# Patient Record
Sex: Female | Born: 1975
Health system: Southern US, Community
[De-identification: ages and names within clinical notes are randomized; demographics above are authoritative.]

## PROBLEM LIST (undated history)

## (undated) DIAGNOSIS — Z86718 Personal history of other venous thrombosis and embolism: Secondary | ICD-10-CM

## (undated) DIAGNOSIS — D689 Coagulation defect, unspecified: Secondary | ICD-10-CM

## (undated) DIAGNOSIS — T7840XA Allergy, unspecified, initial encounter: Secondary | ICD-10-CM

## (undated) DIAGNOSIS — D649 Anemia, unspecified: Secondary | ICD-10-CM

## (undated) DIAGNOSIS — D6861 Antiphospholipid syndrome: Secondary | ICD-10-CM

## (undated) DIAGNOSIS — K219 Gastro-esophageal reflux disease without esophagitis: Secondary | ICD-10-CM

## (undated) DIAGNOSIS — O99119 Other diseases of the blood and blood-forming organs and certain disorders involving the immune mechanism complicating pregnancy, unspecified trimester: Secondary | ICD-10-CM

## (undated) DIAGNOSIS — R000 Tachycardia, unspecified: Secondary | ICD-10-CM

## (undated) DIAGNOSIS — G90A Postural orthostatic tachycardia syndrome (POTS): Secondary | ICD-10-CM

## (undated) DIAGNOSIS — I498 Other specified cardiac arrhythmias: Secondary | ICD-10-CM

## (undated) DIAGNOSIS — I951 Orthostatic hypotension: Secondary | ICD-10-CM

## (undated) DIAGNOSIS — K200 Eosinophilic esophagitis: Secondary | ICD-10-CM

## (undated) HISTORY — DX: Other specified cardiac arrhythmias: I49.8

## (undated) HISTORY — DX: Gastro-esophageal reflux disease without esophagitis: K21.9

## (undated) HISTORY — DX: Tachycardia, unspecified: R00.0

## (undated) HISTORY — DX: Personal history of other venous thrombosis and embolism: Z86.718

## (undated) HISTORY — DX: Postural orthostatic tachycardia syndrome (POTS): G90.A

## (undated) HISTORY — PX: MOHS SURGERY: SUR867

## (undated) HISTORY — DX: Anemia, unspecified: D64.9

## (undated) HISTORY — PX: UPPER GASTROINTESTINAL ENDOSCOPY: SHX188

## (undated) HISTORY — DX: Antiphospholipid syndrome: D68.61

## (undated) HISTORY — PX: ESOPHAGOGASTRODUODENOSCOPY: SHX1529

## (undated) HISTORY — DX: Allergy, unspecified, initial encounter: T78.40XA

## (undated) HISTORY — DX: Antiphospholipid syndrome: O99.119

## (undated) HISTORY — DX: Eosinophilic esophagitis: K20.0

## (undated) HISTORY — PX: LASIK: SHX215

## (undated) HISTORY — DX: Coagulation defect, unspecified: D68.9

## (undated) HISTORY — DX: Orthostatic hypotension: I95.1

---

## 1999-10-17 ENCOUNTER — Other Ambulatory Visit: Admission: RE | Admit: 1999-10-17 | Discharge: 1999-10-17 | Payer: Self-pay | Admitting: Obstetrics and Gynecology

## 1999-11-25 ENCOUNTER — Other Ambulatory Visit: Admission: RE | Admit: 1999-11-25 | Discharge: 1999-11-25 | Payer: Self-pay | Admitting: Obstetrics and Gynecology

## 2000-02-11 ENCOUNTER — Emergency Department (HOSPITAL_COMMUNITY): Admission: EM | Admit: 2000-02-11 | Discharge: 2000-02-11 | Payer: Self-pay | Admitting: Emergency Medicine

## 2000-02-12 ENCOUNTER — Ambulatory Visit (HOSPITAL_COMMUNITY): Admission: RE | Admit: 2000-02-12 | Discharge: 2000-02-12 | Payer: Self-pay | Admitting: *Deleted

## 2000-02-12 ENCOUNTER — Encounter: Payer: Self-pay | Admitting: Emergency Medicine

## 2000-02-28 ENCOUNTER — Other Ambulatory Visit: Admission: RE | Admit: 2000-02-28 | Discharge: 2000-02-28 | Payer: Self-pay | Admitting: Obstetrics and Gynecology

## 2001-03-04 ENCOUNTER — Other Ambulatory Visit: Admission: RE | Admit: 2001-03-04 | Discharge: 2001-03-04 | Payer: Self-pay | Admitting: Obstetrics and Gynecology

## 2001-06-24 ENCOUNTER — Other Ambulatory Visit: Admission: RE | Admit: 2001-06-24 | Discharge: 2001-06-24 | Payer: Self-pay | Admitting: *Deleted

## 2002-03-22 ENCOUNTER — Other Ambulatory Visit: Admission: RE | Admit: 2002-03-22 | Discharge: 2002-03-22 | Payer: Self-pay | Admitting: Obstetrics and Gynecology

## 2002-10-20 ENCOUNTER — Other Ambulatory Visit: Admission: RE | Admit: 2002-10-20 | Discharge: 2002-10-20 | Payer: Self-pay | Admitting: Obstetrics and Gynecology

## 2003-03-30 ENCOUNTER — Other Ambulatory Visit: Admission: RE | Admit: 2003-03-30 | Discharge: 2003-03-30 | Payer: Self-pay | Admitting: Obstetrics and Gynecology

## 2004-05-28 ENCOUNTER — Ambulatory Visit: Payer: Self-pay | Admitting: Internal Medicine

## 2004-05-29 ENCOUNTER — Ambulatory Visit: Payer: Self-pay | Admitting: Oncology

## 2004-08-19 ENCOUNTER — Ambulatory Visit: Payer: Self-pay | Admitting: Oncology

## 2004-10-09 ENCOUNTER — Ambulatory Visit: Payer: Self-pay | Admitting: Oncology

## 2004-11-25 ENCOUNTER — Ambulatory Visit: Payer: Self-pay | Admitting: Oncology

## 2004-12-19 ENCOUNTER — Inpatient Hospital Stay (HOSPITAL_COMMUNITY): Admission: AD | Admit: 2004-12-19 | Discharge: 2004-12-21 | Payer: Self-pay | Admitting: Obstetrics and Gynecology

## 2005-03-03 ENCOUNTER — Ambulatory Visit: Payer: Self-pay | Admitting: Oncology

## 2016-04-15 ENCOUNTER — Telehealth: Payer: Self-pay | Admitting: Internal Medicine

## 2016-04-15 NOTE — Telephone Encounter (Signed)
Patient states her&her husband use to be your patients about 10years ago. They moved back to the area and would like to be your patient again. Her husband is Zebedee IbaMichael Snoke. Please advise. Thank you.

## 2016-04-18 NOTE — Telephone Encounter (Signed)
Patient is calling again about this. Please advise. °

## 2016-04-23 NOTE — Telephone Encounter (Signed)
OK THx 

## 2016-04-23 NOTE — Telephone Encounter (Signed)
I have made both of them an appointment in Nov.

## 2016-05-09 LAB — HEPATIC FUNCTION PANEL
ALK PHOS: 60 U/L (ref 25–125)
ALT: 12 U/L (ref 7–35)
AST: 21 U/L (ref 13–35)
BILIRUBIN, TOTAL: 0.7 mg/dL

## 2016-05-09 LAB — CBC AND DIFFERENTIAL
HCT: 41 % (ref 36–46)
HEMOGLOBIN: 14.2 g/dL (ref 12.0–16.0)
NEUTROS ABS: 5 /uL
PLATELETS: 227 10*3/uL (ref 150–399)
WBC: 8 10^3/mL

## 2016-05-09 LAB — BASIC METABOLIC PANEL
BUN: 14 mg/dL (ref 4–21)
CREATININE: 0.6 mg/dL (ref <=1.1)
Glucose: 92 mg/dL
Potassium: 4.6 mmol/L (ref 3.4–5.3)
SODIUM: 143 mmol/L (ref 137–147)

## 2016-05-26 ENCOUNTER — Encounter: Payer: Self-pay | Admitting: Internal Medicine

## 2016-05-26 ENCOUNTER — Ambulatory Visit (INDEPENDENT_AMBULATORY_CARE_PROVIDER_SITE_OTHER): Payer: BLUE CROSS/BLUE SHIELD | Admitting: Internal Medicine

## 2016-05-26 VITALS — BP 118/92 | HR 78 | Temp 98.5°F | Ht 64.0 in | Wt 169.0 lb

## 2016-05-26 DIAGNOSIS — F4321 Adjustment disorder with depressed mood: Secondary | ICD-10-CM | POA: Insufficient documentation

## 2016-05-26 DIAGNOSIS — Z23 Encounter for immunization: Secondary | ICD-10-CM

## 2016-05-26 DIAGNOSIS — R000 Tachycardia, unspecified: Secondary | ICD-10-CM

## 2016-05-26 DIAGNOSIS — D6861 Antiphospholipid syndrome: Secondary | ICD-10-CM | POA: Diagnosis not present

## 2016-05-26 DIAGNOSIS — F4320 Adjustment disorder, unspecified: Secondary | ICD-10-CM | POA: Diagnosis not present

## 2016-05-26 DIAGNOSIS — I951 Orthostatic hypotension: Secondary | ICD-10-CM

## 2016-05-26 DIAGNOSIS — G90A Postural orthostatic tachycardia syndrome (POTS): Secondary | ICD-10-CM | POA: Insufficient documentation

## 2016-05-26 DIAGNOSIS — Z0000 Encounter for general adult medical examination without abnormal findings: Secondary | ICD-10-CM | POA: Insufficient documentation

## 2016-05-26 MED ORDER — VITAMIN D3 50 MCG (2000 UT) PO CAPS
2000.0000 [IU] | ORAL_CAPSULE | Freq: Every day | ORAL | 3 refills | Status: DC
Start: 2016-05-26 — End: 2016-07-24

## 2016-05-26 MED ORDER — METOPROLOL SUCCINATE ER 25 MG PO TB24: 25.0000 mg | ORAL_TABLET | Freq: Every day | ORAL | 3 refills | Status: DC

## 2016-05-26 NOTE — Assessment & Plan Note (Signed)
We discussed age appropriate health related issues, including available/recomended screening tests and vaccinations. We discussed a need for adhering to healthy diet and exercise. Labs/EKG were reviewed/ordered. All questions were answered.   

## 2016-05-26 NOTE — Assessment & Plan Note (Signed)
2013 on Metoprolol

## 2016-05-26 NOTE — Assessment & Plan Note (Signed)
Remote 2007 -- Dr Darnelle CatalanMagrinat No DVT ASA for travel

## 2016-05-26 NOTE — Progress Notes (Signed)
[facility] results received. PCP reviewed. Results abstracted and sent to scan.

## 2016-05-26 NOTE — Progress Notes (Signed)
Pre visit review using our clinic review tool, if applicable. No additional management support is needed unless otherwise documented below in the visit note. 

## 2016-05-26 NOTE — Progress Notes (Signed)
Subjective:  Patient ID: Pamela Thompson, female    DOB: March 28, 1976  Age: 40 y.o. MRN: [id number]  CC: Annual Exam (annual exam)   HPI Pamela Thompson presents for a new pt visit - well exam. The pt was dx'd with POTS syndrome since 2012-06-18 -  On Metoprolol now.  Anti-cardiolipide Ab  Moved back from [facility] 06/18/2016 Brother died in an accident 06/18/2016   No outpatient prescriptions prior to visit.   No facility-administered medications prior to visit.     ROS Review of Systems  Constitutional: Negative for activity change, appetite change, chills, fatigue and unexpected weight change.  HENT: Negative for congestion, mouth sores and sinus pressure.   Eyes: Negative for visual disturbance.  Respiratory: Negative for cough and chest tightness.   Gastrointestinal: Negative for abdominal pain and nausea.  Genitourinary: Negative for difficulty urinating, frequency and vaginal pain.  Musculoskeletal: Negative for back pain and gait problem.  Skin: Negative for pallor and rash.  Neurological: Negative for dizziness, tremors, weakness, numbness and headaches.  Psychiatric/Behavioral: Negative for confusion, sleep disturbance and suicidal ideas. The patient is not nervous/anxious.     Objective:  BP (!) 118/92 (BP Location: Left Arm, Patient Position: Sitting, Cuff Size: Normal)   Pulse 78   Temp 98.5 F (36.9 C)   Ht 5\' 4"  (1.626 m)   Wt 169 lb (76.7 kg)   SpO2 98%   BMI 29.01 kg/m   BP Readings from Last 3 Encounters:  05/26/16 (!) 118/92    Wt Readings from Last 3 Encounters:  05/26/16 169 lb (76.7 kg)    Physical Exam  Constitutional: She appears well-developed. No distress.  HENT:  Head: Normocephalic.  Right Ear: External ear normal.  Left Ear: External ear normal.  Nose: Nose normal.  Mouth/Throat: Oropharynx is clear and moist.  Eyes: Conjunctivae are normal. Pupils are equal, round, and reactive to light. Right eye exhibits no discharge. Left eye exhibits no  discharge.  Neck: Normal range of motion. Neck supple. No JVD present. No tracheal deviation present. No thyromegaly present.  Cardiovascular: Normal rate, regular rhythm and normal heart sounds.   Pulmonary/Chest: No stridor. No respiratory distress. She has no wheezes.  Abdominal: Soft. Bowel sounds are normal. She exhibits no distension and no mass. There is no tenderness. There is no rebound and no guarding.  Musculoskeletal: She exhibits no edema or tenderness.  Lymphadenopathy:    She has no cervical adenopathy.  Neurological: She displays normal reflexes. No cranial nerve deficit. She exhibits normal muscle tone. Coordination normal.  Skin: No rash noted. No erythema.  Psychiatric: She has a normal mood and affect. Her behavior is normal. Judgment and thought content normal.  tearful  No results found for: WBC, HGB, HCT, PLT, GLUCOSE, CHOL, TRIG, HDL, LDLDIRECT, LDLCALC, ALT, AST, NA, K, CL, CREATININE, BUN, CO2, TSH, PSA, INR, GLUF, HGBA1C, MICROALBUR  No results found.  Assessment & Plan:   There are no diagnoses linked to this encounter. I am having Ms. Thibeau maintain her metoprolol succinate.  Meds ordered this encounter  Medications  . metoprolol succinate (TOPROL-XL) 25 MG 24 hr tablet     Follow-up: No Follow-up on file.  Sonda Primes, MD

## 2016-05-26 NOTE — Assessment & Plan Note (Addendum)
Brother died in the accident 2017 counseling offered

## 2016-05-27 ENCOUNTER — Telehealth: Payer: Self-pay | Admitting: Internal Medicine

## 2016-05-27 NOTE — Telephone Encounter (Signed)
Patient called and said we used the wrong pharmacy. She wanted to make sure from now on we use this one.  [facility] on [address], [place], [place] [address] [phone]

## 2016-05-28 NOTE — Telephone Encounter (Signed)
Updated pharmacy.../lmb 

## 2016-05-30 ENCOUNTER — Other Ambulatory Visit (INDEPENDENT_AMBULATORY_CARE_PROVIDER_SITE_OTHER): Payer: BLUE CROSS/BLUE SHIELD

## 2016-05-30 DIAGNOSIS — G90A Postural orthostatic tachycardia syndrome (POTS): Secondary | ICD-10-CM

## 2016-05-30 DIAGNOSIS — I951 Orthostatic hypotension: Secondary | ICD-10-CM | POA: Diagnosis not present

## 2016-05-30 DIAGNOSIS — R000 Tachycardia, unspecified: Secondary | ICD-10-CM

## 2016-05-30 DIAGNOSIS — Z0000 Encounter for general adult medical examination without abnormal findings: Secondary | ICD-10-CM | POA: Diagnosis not present

## 2016-05-30 LAB — HEPATIC FUNCTION PANEL
ALBUMIN: 4.4 g/dL (ref 3.5–5.2)
ALK PHOS: 42 U/L (ref 39–117)
ALT: 13 U/L (ref 0–35)
AST: 20 U/L (ref 0–37)
BILIRUBIN DIRECT: 0.1 mg/dL (ref 0.0–0.3)
BILIRUBIN TOTAL: 0.7 mg/dL (ref 0.2–1.2)
Total Protein: 7.2 g/dL (ref 6.0–8.3)

## 2016-05-30 LAB — LIPID PANEL
CHOLESTEROL: 174 mg/dL (ref 0–200)
HDL: 52.9 mg/dL (ref >39.00)
LDL Cholesterol: 105 mg/dL — ABNORMAL HIGH (ref 0–99)
NonHDL: 120.62
Total CHOL/HDL Ratio: 3
Triglycerides: 78 mg/dL (ref 0.0–149.0)
VLDL: 15.6 mg/dL (ref 0.0–40.0)

## 2016-05-30 LAB — CBC WITH DIFFERENTIAL/PLATELET
BASOS ABS: 0 10*3/uL (ref 0.0–0.1)
Basophils Relative: 0.4 % (ref 0.0–3.0)
EOS ABS: 0.3 10*3/uL (ref 0.0–0.7)
Eosinophils Relative: 4.3 % (ref 0.0–5.0)
HCT: 41.3 % (ref 36.0–46.0)
Hemoglobin: 14.1 g/dL (ref 12.0–15.0)
LYMPHS ABS: 1.9 10*3/uL (ref 0.7–4.0)
Lymphocytes Relative: 27.1 % (ref 12.0–46.0)
MCHC: 34.1 g/dL (ref 30.0–36.0)
MCV: 92.1 fl (ref 78.0–100.0)
MONO ABS: 0.4 10*3/uL (ref 0.1–1.0)
MONOS PCT: 6.2 % (ref 3.0–12.0)
NEUTROS ABS: 4.3 10*3/uL (ref 1.4–7.7)
NEUTROS PCT: 62 % (ref 43.0–77.0)
PLATELETS: 231 10*3/uL (ref 150.0–400.0)
RBC: 4.49 Mil/uL (ref 3.87–5.11)
RDW: 13.1 % (ref 11.5–15.5)
WBC: 6.9 10*3/uL (ref 4.0–10.5)

## 2016-05-30 LAB — TSH: TSH: 2.74 u[IU]/mL (ref 0.35–4.50)

## 2016-05-30 LAB — BASIC METABOLIC PANEL
BUN: 15 mg/dL (ref 6–23)
CALCIUM: 9.6 mg/dL (ref 8.4–10.5)
CO2: 27 meq/L (ref 19–32)
CREATININE: 0.81 mg/dL (ref 0.40–1.20)
Chloride: 103 mEq/L (ref 96–112)
GFR: 82.5 mL/min (ref >60.00)
GLUCOSE: 88 mg/dL (ref 70–99)
Potassium: 4 mEq/L (ref 3.5–5.1)
SODIUM: 138 meq/L (ref 135–145)

## 2016-05-30 LAB — URINALYSIS
BILIRUBIN URINE: NEGATIVE
Ketones, ur: NEGATIVE
LEUKOCYTES UA: NEGATIVE
NITRITE: NEGATIVE
PH: 6 (ref 5.0–8.0)
Specific Gravity, Urine: <=1.005 — AB (ref 1.000–1.030)
TOTAL PROTEIN, URINE-UPE24: NEGATIVE
Urine Glucose: NEGATIVE
Urobilinogen, UA: 0.2 (ref 0.0–1.0)

## 2016-06-16 ENCOUNTER — Telehealth: Payer: Self-pay | Admitting: Internal Medicine

## 2016-06-16 MED ORDER — MEBENDAZOLE 100 MG PO CHEW: CHEWABLE_TABLET | ORAL | 0 refills | Status: DC

## 2016-06-16 NOTE — Telephone Encounter (Signed)
Patient states her daughter had pinworms and the whole family to OTC medication.  Patient still feels like she might have it.  Would like to know if she should take OTC medication again or does she need to come in.?

## 2016-06-16 NOTE — Telephone Encounter (Signed)
Ok Mebendazole as directed [facility](Rx emailed) Thx

## 2016-06-17 MED ORDER — MEBENDAZOLE 100 MG PO CHEW: CHEWABLE_TABLET | ORAL | 0 refills | Status: DC

## 2016-06-17 NOTE — Telephone Encounter (Signed)
Patient called back. She was informed.

## 2016-06-17 NOTE — Addendum Note (Signed)
Addended by: Merrilyn PumaSIMMONS, Roni Friberg N on: 06/17/2016 01:33 PM   Modules accepted: Orders

## 2016-06-17 NOTE — Telephone Encounter (Signed)
Printed rx signed/faxed to pharmacy.  

## 2016-07-24 ENCOUNTER — Encounter: Payer: Self-pay | Admitting: Nurse Practitioner

## 2016-07-24 ENCOUNTER — Ambulatory Visit (INDEPENDENT_AMBULATORY_CARE_PROVIDER_SITE_OTHER): Payer: Managed Care, Other (non HMO) | Admitting: Nurse Practitioner

## 2016-07-24 ENCOUNTER — Other Ambulatory Visit (INDEPENDENT_AMBULATORY_CARE_PROVIDER_SITE_OTHER): Payer: Managed Care, Other (non HMO)

## 2016-07-24 VITALS — BP 122/60 | HR 89 | Temp 98.3°F | Resp 14 | Ht 64.0 in | Wt 168.8 lb

## 2016-07-24 DIAGNOSIS — R002 Palpitations: Secondary | ICD-10-CM | POA: Diagnosis not present

## 2016-07-24 LAB — BASIC METABOLIC PANEL
BUN: 9 mg/dL (ref 6–23)
CO2: 32 meq/L (ref 19–32)
CREATININE: 0.78 mg/dL (ref 0.40–1.20)
Calcium: 10.1 mg/dL (ref 8.4–10.5)
Chloride: 102 mEq/L (ref 96–112)
GFR: 86.11 mL/min (ref >60.00)
GLUCOSE: 101 mg/dL — AB (ref 70–99)
Potassium: 4.2 mEq/L (ref 3.5–5.1)
SODIUM: 141 meq/L (ref 135–145)

## 2016-07-24 LAB — MAGNESIUM: Magnesium: 1.9 mg/dL (ref 1.5–2.5)

## 2016-07-24 LAB — HEMOGLOBIN AND HEMATOCRIT, BLOOD
HEMATOCRIT: 41.2 % (ref 36.0–46.0)
HEMOGLOBIN: 14.2 g/dL (ref 12.0–15.0)

## 2016-07-24 NOTE — Patient Instructions (Addendum)
No acute finding on EKG. No previous ECG to compare.  Go to basement for blood draw. You will be called with results.  Consider referral to cardiology if normal labs.   Palpitations Introduction A palpitation is the feeling that your heart:  Has an uneven (irregular) heartbeat.  Is beating faster than normal.  Is fluttering.  Is skipping a beat. This is usually not a serious problem. In some cases, you may need more medical tests. Follow these instructions at home:  Avoid:  Caffeine in coffee, tea, soft drinks, diet pills, and energy drinks.  Chocolate.  Alcohol.  Do not use any tobacco products. These include cigarettes, chewing tobacco, and e-cigarettes. If you need help quitting, ask your doctor.  Try to reduce your stress. These things may help:  Yoga.  Meditation.  Physical activity. Swimming, jogging, and walking are good choices.  A method that helps you use your mind to control things in your body, like heartbeats (biofeedback).  Get plenty of rest and sleep.  Take over-the-counter and prescription medicines only as told by your doctor.  Keep all follow-up visits as told by your doctor. This is important. Contact a doctor if:  Your heartbeat is still fast or uneven after 24 hours.  Your palpitations occur more often. Get help right away if:  You have chest pain.  You feel short of breath.  You have a very bad headache.  You feel dizzy.  You pass out (faint). This information is not intended to replace advice given to you by your health care provider. Make sure you discuss any questions you have with your health care provider. Document Released: 04/08/2008 Document Revised: 12/06/2015 Document Reviewed: 03/15/2015  2017 Elsevier

## 2016-07-24 NOTE — Progress Notes (Signed)
Pre visit review using our clinic review tool, if applicable. No additional management support is needed unless otherwise documented below in the visit note. 

## 2016-07-24 NOTE — Progress Notes (Signed)
Subjective:  Patient ID: Pamela Thompson, female    DOB: [birth year]  Age: 41 y.o. MRN: [id number]  CC: Acute Visit (patient says she feels her heart twitching and feels a pause x 3 days)   Palpitations   This is a new problem. The current episode started in the past 7 days. The problem occurs every several days (every other day). The problem has been waxing and waning. Nothing aggravates the symptoms. Associated symptoms include an irregular heartbeat. Pertinent negatives include no anxiety, chest fullness, chest pain, coughing, diaphoresis, dizziness, fever, malaise/fatigue, nausea, near-syncope, numbness, shortness of breath, syncope, vomiting or weakness. Associated symptoms comments: No change in sleep habits. No OTC medication used. She has tried beta blockers (elimination of ETOH and caffeine. increased fluid intake. use of metoprolol x 3-4years to manage POTS) for the symptoms. The treatment provided no relief. There are no known risk factors. There is no history of anemia, anxiety, drug use, heart disease or hyperthyroidism.  current symptoms not similar to POTS per patient. Husband had vasectomy, hence no chance of pregnancy.  Outpatient Medications Prior to Visit  Medication Sig Dispense Refill  . metoprolol succinate (TOPROL-XL) 25 MG 24 hr tablet Take 1 tablet (25 mg total) by mouth daily. 90 tablet 3  . Cholecalciferol (VITAMIN D3) 2000 units capsule Take 1 capsule (2,000 Units total) by mouth daily. (Patient not taking: Reported on 07/24/2016) 100 capsule 3  . mebendazole (VERMOX) 100 MG chewable tablet Take 1 tab by mouth once. Repeat in 3 weeks if needed (Patient not taking: Reported on 07/24/2016) 2 tablet 0   No facility-administered medications prior to visit.     ROS See HPI  Objective:  BP 122/60   Pulse 89   Temp 98.3 F (36.8 C) (Oral)   Resp 14   Ht 5\' 4"  (1.626 m)   Wt 168 lb 12.8 oz (76.6 kg)   SpO2 98%   BMI 28.97 kg/m   BP Readings from Last 3  Encounters:  07/24/16 122/60  05/26/16 (!) 118/92    Wt Readings from Last 3 Encounters:  07/24/16 168 lb 12.8 oz (76.6 kg)  05/26/16 169 lb (76.7 kg)    Physical Exam  Constitutional: She is oriented to person, place, and time. No distress.  Neck: Normal range of motion. Neck supple.  Cardiovascular: Normal rate, regular rhythm, normal heart sounds and intact distal pulses.  Exam reveals no gallop and no friction rub.   No murmur heard. Pulmonary/Chest: Effort normal and breath sounds normal. No respiratory distress. She has no wheezes.  Musculoskeletal: Normal range of motion. She exhibits no edema.  Neurological: She is alert and oriented to person, place, and time.  Vitals reviewed.   Lab Results  Component Value Date   WBC 6.9 05/30/2016   HGB 14.2 07/24/2016   HCT 41.2 07/24/2016   PLT 231.0 05/30/2016   GLUCOSE 101 (H) 07/24/2016   CHOL 174 05/30/2016   TRIG 78.0 05/30/2016   HDL 52.90 05/30/2016   LDLCALC 105 (H) 05/30/2016   ALT 13 05/30/2016   AST 20 05/30/2016   NA 141 07/24/2016   K 4.2 07/24/2016   CL 102 07/24/2016   CREATININE 0.78 07/24/2016   BUN 9 07/24/2016   CO2 32 07/24/2016   TSH 2.74 05/30/2016   ECG: normal sinus rhythm, no ST or T wave abnormality.  Assessment & Plan:   Jesiah was seen today for acute visit.  Diagnoses and all orders for this visit:  Palpitations -  Magnesium; Future -     Basic metabolic panel; Future -     Hemoglobin and Hematocrit, Blood; Future -     EKG 12-Lead   I have discontinued Ms. Beecham's Vitamin D3 and mebendazole. I am also having her maintain her metoprolol succinate.  No orders of the defined types were placed in this encounter.  Recent Results (from the past 2160 hour(s))  CBC and differential     Status: None   Collection Time: 05/09/16 12:00 AM  Result Value Ref Range   Hemoglobin 14.2 12.0 - 16.0 g/dL   HCT 41 36 - 46 %   Neutrophils Absolute 5 /L   Platelets 227 150 - 399 K/L   WBC  8.0 10^3/mL  Basic metabolic panel     Status: None   Collection Time: 05/09/16 12:00 AM  Result Value Ref Range   Glucose 92 mg/dL   BUN 14 4 - 21 mg/dL   Creatinine 0.6 0.5 - 1.1 mg/dL   Potassium 4.6 3.4 - 5.3 mmol/L   Sodium 143 137 - 147 mmol/L  Hepatic function panel     Status: None   Collection Time: 05/09/16 12:00 AM  Result Value Ref Range   Alkaline Phosphatase 60 25 - 125 U/L   ALT 12 7 - 35 U/L   AST 21 13 - 35 U/L   Bilirubin, Total 0.7 mg/dL  Basic metabolic panel     Status: None   Collection Time: 05/30/16  8:39 AM  Result Value Ref Range   Sodium 138 135 - 145 mEq/L   Potassium 4.0 3.5 - 5.1 mEq/L   Chloride 103 96 - 112 mEq/L   CO2 27 19 - 32 mEq/L   Glucose, Bld 88 70 - 99 mg/dL   BUN 15 6 - 23 mg/dL   Creatinine, Ser 1.610.81 0.40 - 1.20 mg/dL   Calcium 9.6 8.4 - 09.610.5 mg/dL   GFR 04.5482.50 >09.81>60.00 mL/min  CBC with Differential/Platelet     Status: None   Collection Time: 05/30/16  8:39 AM  Result Value Ref Range   WBC 6.9 4.0 - 10.5 K/uL   RBC 4.49 3.87 - 5.11 Mil/uL   Hemoglobin 14.1 12.0 - 15.0 g/dL   HCT 19.141.3 47.836.0 - 29.546.0 %   MCV 92.1 78.0 - 100.0 fl   MCHC 34.1 30.0 - 36.0 g/dL   RDW 62.113.1 30.811.5 - 65.715.5 %   Platelets 231.0 150.0 - 400.0 K/uL   Neutrophils Relative % 62.0 43.0 - 77.0 %   Lymphocytes Relative 27.1 12.0 - 46.0 %   Monocytes Relative 6.2 3.0 - 12.0 %   Eosinophils Relative 4.3 0.0 - 5.0 %   Basophils Relative 0.4 0.0 - 3.0 %   Neutro Abs 4.3 1.4 - 7.7 K/uL   Lymphs Abs 1.9 0.7 - 4.0 K/uL   Monocytes Absolute 0.4 0.1 - 1.0 K/uL   Eosinophils Absolute 0.3 0.0 - 0.7 K/uL   Basophils Absolute 0.0 0.0 - 0.1 K/uL  Hepatic function panel     Status: None   Collection Time: 05/30/16  8:39 AM  Result Value Ref Range   Total Bilirubin 0.7 0.2 - 1.2 mg/dL   Bilirubin, Direct 0.1 0.0 - 0.3 mg/dL   Alkaline Phosphatase 42 39 - 117 U/L   AST 20 0 - 37 U/L   ALT 13 0 - 35 U/L   Total Protein 7.2 6.0 - 8.3 g/dL   Albumin 4.4 3.5 - 5.2 g/dL    Urinalysis  Status: Abnormal   Collection Time: 05/30/16  8:39 AM  Result Value Ref Range   Color, Urine YELLOW Yellow;Lt. Yellow   APPearance CLEAR Clear   Specific Gravity, Urine <=1.005 (A) 1.000 - 1.030   pH 6.0 5.0 - 8.0   Total Protein, Urine NEGATIVE Negative   Urine Glucose NEGATIVE Negative   Ketones, ur NEGATIVE Negative   Bilirubin Urine NEGATIVE Negative   Hgb urine dipstick TRACE-INTACT (A) Negative   Urobilinogen, UA 0.2 0.0 - 1.0   Leukocytes, UA NEGATIVE Negative   Nitrite NEGATIVE Negative  TSH     Status: None   Collection Time: 05/30/16  8:39 AM  Result Value Ref Range   TSH 2.74 0.35 - 4.50 uIU/mL  Lipid panel     Status: Abnormal   Collection Time: 05/30/16  8:39 AM  Result Value Ref Range   Cholesterol 174 0 - 200 mg/dL    Comment: ATP III Classification       Desirable:  < 200 mg/dL               Borderline High:  200 - 239 mg/dL          High:  > = 161 mg/dL   Triglycerides 09.6 0.0 - 149.0 mg/dL    Comment: Normal:  <045 mg/dLBorderline High:  150 - 199 mg/dL   HDL 40.98 >11.91 mg/dL   VLDL 47.8 0.0 - 29.5 mg/dL   LDL Cholesterol 621 (H) 0 - 99 mg/dL   Total CHOL/HDL Ratio 3     Comment:                Men          Women1/2 Average Risk     3.4          3.3Average Risk          5.0          4.42X Average Risk          9.6          7.13X Average Risk          15.0          11.0                       NonHDL 120.62     Comment: NOTE:  Non-HDL goal should be 30 mg/dL higher than patient's LDL goal (i.e. LDL goal of < 70 mg/dL, would have non-HDL goal of < 100 mg/dL)  Magnesium     Status: None   Collection Time: 07/24/16  2:15 PM  Result Value Ref Range   Magnesium 1.9 1.5 - 2.5 mg/dL  Basic metabolic panel     Status: Abnormal   Collection Time: 07/24/16  2:15 PM  Result Value Ref Range   Sodium 141 135 - 145 mEq/L   Potassium 4.2 3.5 - 5.1 mEq/L   Chloride 102 96 - 112 mEq/L   CO2 32 19 - 32 mEq/L   Glucose, Bld 101 (H) 70 - 99 mg/dL   BUN 9 6  - 23 mg/dL   Creatinine, Ser 3.08 0.40 - 1.20 mg/dL   Calcium 65.7 8.4 - 84.6 mg/dL   GFR 96.29 >52.84 mL/min  Hemoglobin and Hematocrit, Blood     Status: None   Collection Time: 07/24/16  2:15 PM  Result Value Ref Range   HCT 41.2 36.0 - 46.0 %   Hemoglobin 14.2 12.0 - 15.0 g/dL   Follow-up:  Return if symptoms worsen or fail to improve.  Wilfred Lacy, NP

## 2016-07-28 NOTE — Progress Notes (Signed)
Cardiology Office Note    Date:  07/29/2016   ID:  Pamela Thompson, DOB 02/27/1976, MRN [id number]  PCP:  Sonda PrimesAlex Plotnikov, MD  Cardiologist:  [facility] - Dr. Herbie BaltimoreHarding  Chief Complaint  Patient presents with  . Follow-up    New patient. Palpitations.    History of Present Illness:    Pamela Thompson is a 41 y.o. female with past medical history of POTS who presents to the office as a new patient for evaluation of palpitations.   She was seen by her PCP on 07/24/2016 and reported a twitching sensation along her heart, denying any associated chest pain, dyspnea, or presyncope. An EKG was obtained which showed NSR with no acute ischemic changes. Hgb and electrolytes were within normal limits. TSH checked in 05/2016 and normal at that time.   In talking with the patient today, she reports having new-onset palpitations starting two weeks prior. Had consumed more alcohol than normal over the weekend, having two glasses of wine per day, and developing palpitations the following day. She thought this was secondary to her alcohol intake, but the palpitations have continued to occur. Reports they last for several minutes at a time and spontaneously resolve, occurring up to 3-4 times per day.  Feels a "thumping" sensation in her chest when this occurs. No associated chest discomfort, dyspnea with exertion, dizziness, lightheadedness, or presyncope. Does report having intermittent episodes of nausea. Says this does not resemble her prior episodes of POTS. She has limited her caffeine intake with no improvement in her symptoms.   She reports good compliance with her Toprol-XL 25mg  daily.   She was exercising daily prior to Christmas in training for a half-marathon and reports not being as active over the past two weeks due to family visiting over the holidays and children being out of school.  She denies any prior history of CAD, HTN, HLD, or cardiac arrhythmias. She did have a blood clotting disorder  at the time of both of her pregnancies. Was followed by Hematology and they advised she not take oral contraceptives. Takes an ASA in the setting of prolonged travel. Family history is significant for HTN in her father and colon cancer in her mother and maternal grandparents.   Past Medical History:  Diagnosis Date  . POTS (postural orthostatic tachycardia syndrome)     History reviewed. No pertinent surgical history.  Current Medications: Outpatient Medications Prior to Visit  Medication Sig Dispense Refill  . metoprolol succinate (TOPROL-XL) 25 MG 24 hr tablet Take 1 tablet (25 mg total) by mouth daily. 90 tablet 3   No facility-administered medications prior to visit.      Allergies:   Minocycline and Penicillins   Social History   Social History  . Marital status: Married    Spouse name: N/A  . Number of children: N/A  . Years of education: N/A   Social History Main Topics  . Smoking status: Never Smoker  . Smokeless tobacco: Never Used  . Alcohol use Yes  . Drug use: No  . Sexual activity: Yes   Other Topics Concern  . None   Social History Narrative  . None     Family History:  The patient's family history includes Cancer in her maternal grandfather, maternal grandmother, and mother; Hypertension in her father.   Review of Systems:   Please see the history of present illness.     General:  No chills, fever, night sweats or weight changes.  Cardiovascular:  No chest pain, dyspnea on exertion, edema, orthopnea, paroxysmal nocturnal dyspnea. Positive for palpitations.  Dermatological: No rash, lesions/masses Respiratory: No cough, dyspnea Urologic: No hematuria, dysuria Abdominal:   No nausea, vomiting, diarrhea, bright red blood per rectum, melena, or hematemesis Neurologic:  No visual changes, wkns, changes in mental status. All other systems reviewed and are otherwise negative except as noted above.   Physical Exam:    VS:  BP 104/72   Pulse 92   Ht  5\' 4"  (1.626 m)   Wt 168 lb (76.2 kg)   BMI 28.84 kg/m    General: Well developed, well nourished Caucasian female appearing in no acute distress. Head: Normocephalic, atraumatic, sclera non-icteric, no xanthomas, nares are without discharge.  Neck: No carotid bruits. JVD not elevated.  Lungs: Respirations regular and unlabored, without wheezes or rales.  Heart: Regular rate and rhythm. No S3 or S4.  No murmur, no rubs, or gallops appreciated. Abdomen: Soft, non-tender, non-distended with normoactive bowel sounds. No hepatomegaly. No rebound/guarding. No obvious abdominal masses. Msk:  Strength and tone appear normal for age. No joint deformities or effusions. Extremities: No clubbing or cyanosis. No edema.  Distal pedal pulses are 2+ bilaterally. Neuro: Alert and oriented X 3. Moves all extremities spontaneously. No focal deficits noted. Psych:  Responds to questions appropriately with a normal affect. Skin: No rashes or lesions noted  Wt Readings from Last 3 Encounters:  07/29/16 168 lb (76.2 kg)  07/24/16 168 lb 12.8 oz (76.6 kg)  05/26/16 169 lb (76.7 kg)     Studies/Labs Reviewed:   EKG:  EKG is not ordered today.   Recent Labs: 05/30/2016: ALT 13; Platelets 231.0; TSH 2.74 07/24/2016: BUN 9; Creatinine, Ser 0.78; Hemoglobin 14.2; Magnesium 1.9; Potassium 4.2; Sodium 141   Lipid Panel    Component Value Date/Time   CHOL 174 05/30/2016 0839   TRIG 78.0 05/30/2016 0839   HDL 52.90 05/30/2016 0839   CHOLHDL 3 05/30/2016 0839   VLDL 15.6 05/30/2016 0839   LDLCALC 105 (H) 05/30/2016 0839    Additional studies/ records that were reviewed today include:   EKG: 07/24/2016: NSR, HR 90, with no acute ST or T-wave changes. No prior tracings available for comparison.   Assessment:    1. Heart palpitations   2. POTS (postural orthostatic tachycardia syndrome)      Plan:   In order of problems listed above:  1. Palpitations - reports developing palpitations two weeks  prior which occurred initially after consuming a few glasses of wine but the episodes have continued to occur, despite limiting alcohol and caffeine consumption. Palpitations last for several minutes at a time and spontaneously resolve, occurring up to 3-4 times per day. She denies any associated chest pain, dyspnea with exertion, lightheadedness, dizziness, or presyncope.  - recent EKG reviewed which shows NSR with no acute ischemic changes or irregular rhythms. Hgb and electrolytes were within normal limits. TSH checked in 05/2016 and normal at that time.  - would recommend a cardiac event monitor to evaluate for any arrhythmias. This was discussed with Dr. Herbie Baltimore (DOD) as she is a new patient to our practice and he was in agreement with this plan. She is taking a cruise in 2 weeks and would like to have her monitor off prior to then. With her symptoms occurring almost daily, will monitor for 7 days. If still having palpitations and monitor has not resulted prior to her cruise, can consider PRN short-acting Lopressor to take as needed for extended palpitations.  2. POTS - reports being diagnosed 10+ years ago. Symptoms are well-controlled.  - continue Toprol-XL 25mg  daily.    Medication Adjustments/Labs and Tests Ordered: Current medicines are reviewed at length with the patient today.  Concerns regarding medicines are outlined above.  Medication changes, Labs and Tests ordered today are listed in the Patient Instructions below. Patient Instructions  Medication Instructions:  Your physician recommends that you continue on your current medications as directed. Please refer to the Current Medication list given to you today.  If you need a refill on your cardiac medications before your next appointment, please call your pharmacy.  Labwork: NONE  Follow-Up: Your physician recommends that you schedule a follow-up appointment in: AS NEEDED WITH DR [facility]   Special Instructions: Your physician  has recommended that you wear a 1 WEEK event monitor, PLEASE SCHEDULE THIS WEEK. Event monitors are medical devices that record the heart's electrical activity. Doctors most often [place] these monitors to diagnose arrhythmias. Arrhythmias are problems with the speed or rhythm of the heartbeat. The monitor is a small, portable device. You can wear one while you do your normal daily activities. This is usually used to diagnose what is causing palpitations/syncope (passing out).    Lorri Frederick, [place]  07/29/2016 9:14 PM    [facility] [address] [place], [address] [place], [place]  [address] Phone: [phone]; Fax: [phone]  [address], [address] [place], [place] [address] Phone: [phone]

## 2016-07-29 ENCOUNTER — Encounter (INDEPENDENT_AMBULATORY_CARE_PROVIDER_SITE_OTHER): Payer: Self-pay

## 2016-07-29 ENCOUNTER — Ambulatory Visit (INDEPENDENT_AMBULATORY_CARE_PROVIDER_SITE_OTHER): Payer: Managed Care, Other (non HMO) | Admitting: Student

## 2016-07-29 ENCOUNTER — Ambulatory Visit (INDEPENDENT_AMBULATORY_CARE_PROVIDER_SITE_OTHER): Payer: Managed Care, Other (non HMO)

## 2016-07-29 ENCOUNTER — Encounter: Payer: Self-pay | Admitting: Student

## 2016-07-29 VITALS — BP 104/72 | HR 92 | Ht 64.0 in | Wt 168.0 lb

## 2016-07-29 DIAGNOSIS — I951 Orthostatic hypotension: Secondary | ICD-10-CM

## 2016-07-29 DIAGNOSIS — R000 Tachycardia, unspecified: Secondary | ICD-10-CM

## 2016-07-29 DIAGNOSIS — R002 Palpitations: Secondary | ICD-10-CM

## 2016-07-29 DIAGNOSIS — G90A Postural orthostatic tachycardia syndrome (POTS): Secondary | ICD-10-CM

## 2016-07-29 NOTE — Patient Instructions (Addendum)
Medication Instructions:  Your physician recommends that you continue on your current medications as directed. Please refer to the Current Medication list given to you today.  If you need a refill on your cardiac medications before your next appointment, please call your pharmacy.  Labwork: NONE  Follow-Up: Your physician recommends that you schedule a follow-up appointment in: AS [facility]   Special Instructions: Your physician has recommended that you wear a 1 WEEK event monitor, PLEASE SCHEDULE THIS WEEK. Event monitors are medical devices that record the heart's electrical activity. Doctors most often us these monitors to diagnose arrhythmias. Arrhythmias are problems with the speed or rhythm of the heartbeat. The monitor is a small, portable device. You can wear one while you do your normal daily activities. This is usually used to diagnose what is causing palpitations/syncope (passing out).      Thank you for choosing [facility]!!    Joscelynn Brutus, LPN

## 2016-08-11 ENCOUNTER — Telehealth: Payer: Self-pay | Admitting: Student

## 2016-08-11 NOTE — Telephone Encounter (Signed)
It has not yet resulted (is listed as waiting to be read and I cannot see the report). I had mentioned at her last visit if it was not resulted and she was leaving for her cruise, we could do PRN Lopressor as needed for extended palpitations. If she is still having palpitations, please send in short acting Lopressor 12.5mg  to take as needed for palpitations. Continue Toprol-XL as previously on. Thank you!

## 2016-08-11 NOTE — Telephone Encounter (Signed)
Returned pts call.  See Randall AnBrittany Strader, PA-C note. Lm for pt to call back

## 2016-08-11 NOTE — Telephone Encounter (Signed)
Pt calling to see if we have any results from her heart monitor-

## 2016-08-11 NOTE — Telephone Encounter (Signed)
Patient is returning your call,thanks. °

## 2016-08-12 MED ORDER — METOPROLOL TARTRATE 25 MG PO TABS: 12.5000 mg | ORAL_TABLET | ORAL | 0 refills | Status: DC | PRN

## 2016-08-12 NOTE — Telephone Encounter (Signed)
Returned pts call.  She hasn't had any palpitations since her o/v.  She wasn't sure if it was cutting out the caffeine, alcohol (which she never drank much,) or starting the exercising again.  Pt did want us to call in the Lopressor 12.5 prn for palpitations, due to the fact, that she doesn't want to be feeling bad on her cruise.  Called in lopressor 12.5 prn palpitations #45 no refills. Pt verbalized appreciation and understanding.

## 2016-08-21 ENCOUNTER — Telehealth: Payer: Self-pay | Admitting: Student

## 2016-08-21 NOTE — Telephone Encounter (Signed)
Monitor was read. Was pretty much totally normal.  Occasional PVCs, no arrhythmias.   Pamela Lemmaavid Carless Slatten, MD

## 2016-08-21 NOTE — Telephone Encounter (Signed)
Patient called in regarding her monitor results - ordered by B. [place] OuchStrader, PA, uploaded in [place], not yet reviewed  Advised will route to her MD for result note  Sent to Dr. Donneta RombergHarding & Sharon, RN

## 2016-08-21 NOTE — Telephone Encounter (Signed)
new message   pt verbalized that she is calling for the rn to return her call for monitor results

## 2016-08-22 NOTE — Telephone Encounter (Signed)
SPOKE TO PATIENT   INFORMATION GIVEN. PATIENT STATES SHE IS STILL HAVING SYMPTOMS BUT NOT AS MUCH , WOULD TO MAKE A APPOINTMENT TO DISCUSS  APPOINTMENT MADE ,IF GETS BETTER WILL CALL AN CANCEL.

## 2016-08-22 NOTE — Telephone Encounter (Signed)
Returning your call from yesterday. °

## 2016-08-22 NOTE — Telephone Encounter (Signed)
LEFT MESSAGE TO CALL BACK-  IN REGARDS TO MONITOR RESULTS .  MAY RETURN ON AS NEED BASIS

## 2016-08-25 ENCOUNTER — Telehealth: Payer: Self-pay | Admitting: Cardiology

## 2016-08-25 NOTE — Telephone Encounter (Signed)
New message    Patient calling back to speak with nurse - results

## 2016-08-25 NOTE — Telephone Encounter (Signed)
Returned pts call and advised her of the heart monitor results.  She verbalized understanding.

## 2016-09-16 ENCOUNTER — Encounter: Payer: Self-pay | Admitting: Cardiology

## 2016-09-16 ENCOUNTER — Ambulatory Visit (INDEPENDENT_AMBULATORY_CARE_PROVIDER_SITE_OTHER): Payer: Managed Care, Other (non HMO) | Admitting: Cardiology

## 2016-09-16 DIAGNOSIS — I951 Orthostatic hypotension: Secondary | ICD-10-CM

## 2016-09-16 DIAGNOSIS — R000 Tachycardia, unspecified: Secondary | ICD-10-CM | POA: Diagnosis not present

## 2016-09-16 DIAGNOSIS — R002 Palpitations: Secondary | ICD-10-CM

## 2016-09-16 DIAGNOSIS — G90A Postural orthostatic tachycardia syndrome (POTS): Secondary | ICD-10-CM

## 2016-09-16 NOTE — Patient Instructions (Addendum)
IF you have spell like you described to Dr Herbie BaltimoreHARDING - YOU MAY USE YOUR AS NEEDED METOPROLOL    NO OTHER CHANGES AT PRESENT TIME    Your physician wants you to follow-up in 12 MONTHS WITH DR HARDING. You will receive a reminder letter in the mail two months in advance. If you don't receive a letter, please call our office to schedule the follow-up appointment.   If you need a refill on your cardiac medications before your next appointment, please call your pharmacy.

## 2016-09-16 NOTE — Progress Notes (Signed)
PCP: Sonda Primes, MD  Clinic Note: No chief complaint on file.   HPI: Pamela Thompson is a 41 y.o. female with a PMH below who presents today for follow-up after evaluation by [place] for heart palpitations - back in January.. She apparently has a history in the past of having POTS added piece and well controlled with Toprol. When she saw [place], she was describing about a 2 week and of frequent sudden onset fast heartbeats and then going down slowly again. He'll somewhat weird and dizzy with them. It was occurring with mostly with sitting and not with walking. It didn't feel like her prior symptoms.  Recent Hospitalizations: None  Studies Reviewed:   Event Monitor:   Essentially normal monitor.  Mostly sinus rhythm, occasionally with artifact. Maximum heart rate 142 BPM, minimum heart rate 50 BPM. Average heart rate 88 DPM  Occasional PACs and PVCs noted, no arrhythmia.  No symptoms noted with 2 relatively stable events mainly detected  Interval History: Pamela Thompson actually presents today stating that over the last few weeks, her palpitations are really all but gone away. It was really that 2 week period. When she is wearing her monitor she felt some of his episodes. However that one episode that was sinus tachycardia, she thinks was probably when she was active. Unfortunately the majority of her episode did not occur while wearing the monitor. She thinks that these episodes may have been triggered by the fact that over the Christmas break when her mother was visiting, she and her mother would have a few glasses of wine which is an unusual thing for her. At present, she is not noticing any lightheadedness or dizziness or syncopal/syncope.   Besides those episodes, she's been doing fine. She is able to exercise and do her daily living activities without any problems with chest tightness or pressure. No dyspnea. No heart failure symptoms of PND, orthopnea or edema. No  syncope/near syncope. No TIA/amaurosis fugax symptoms.  No claudication.  ROS: A comprehensive was performed. Review of Systems  Constitutional: Negative for malaise/fatigue.  HENT: Negative for congestion and nosebleeds.   Respiratory: Negative for cough, shortness of breath and wheezing.   Gastrointestinal: Negative for blood in stool and melena.  Genitourinary: Negative for hematuria.  Musculoskeletal: Negative for falls.  Neurological: Positive for dizziness (Notably improved). Negative for loss of consciousness.  Psychiatric/Behavioral: The patient is not nervous/anxious and does not have insomnia.   All other systems reviewed and are negative.   Past Medical History:  Diagnosis Date  . POTS (postural orthostatic tachycardia syndrome)     No past surgical history on file.  Current Meds  Medication Sig  . metoprolol succinate (TOPROL-XL) 25 MG 24 hr tablet Take 1 tablet (25 mg total) by mouth daily.  . metoprolol tartrate (LOPRESSOR) 25 MG tablet Take 0.5 tablets (12.5 mg total) by mouth as needed (palpitations).  - Has not used any when necessary dosing  Allergies  Allergen Reactions  . Minocycline Hives  . Penicillins     Social History   Social History  . Marital status: Married    Spouse name: N/A  . Number of children: N/A  . Years of education: N/A   Social History Main Topics  . Smoking status: Never Smoker  . Smokeless tobacco: Never Used  . Alcohol use Yes  . Drug use: No  . Sexual activity: Yes   Other Topics Concern  . None   Social History Narrative  . None  family history includes Cancer in her maternal grandfather, maternal grandmother, and mother; Hypertension in her father.  Wt Readings from Last 3 Encounters:  09/16/16 77.6 kg (171 lb)  07/29/16 76.2 kg (168 lb)  07/24/16 76.6 kg (168 lb 12.8 oz)    PHYSICAL EXAM BP 110/60   Pulse 82   Ht 5\' 4"  (1.626 m)   Wt 77.6 kg (171 lb)   SpO2 98%   BMI 29.35 kg/m  General  appearance: alert, cooperative, appears stated age, no distress and Borderline obese, but healthy-appearing. Well-nourished and well-groomed. Neck: no adenopathy, no carotid bruit and no JVD Lungs: clear to auscultation bilaterally, normal percussion bilaterally and non-labored Heart: regular rate and rhythm, S1 and S2 normal, no murmur, click, rub or gallop ; nondisplaced PMI Abdomen: soft, non-tender; bowel sounds normal; no masses,  no organomegaly;  Extremities: extremities normal, atraumatic, no cyanosis, or edema  Pulses: 2+ and symmetric Neurologic: Mental status: Alert, oriented, thought content appropriate    Adult ECG Report N/a   Other studies Reviewed: Additional studies/ records that were reviewed today include:  Recent Labs:   Lab Results  Component Value Date   CREATININE 0.78 07/24/2016   BUN 9 07/24/2016   NA 141 07/24/2016   K 4.2 07/24/2016   CL 102 07/24/2016   CO2 32 07/24/2016   Lab Results  Component Value Date   TSH 2.74 05/30/2016    ASSESSMENT / PLAN: Problem List Items Addressed This Visit    Heart palpitations    I think what she was feeling sounds very consistent with bigeminy. She didn't really describe as a rapid heartbeat which was what she felt with POTTS. She really didn't fear like she had near syncope or syncope, just a little bit dizzy. The episodes didn't last more in 3-4 minutes. We talked about some vagal maneuvers but it doesn't sound like SVT or A. fib. My recommendation would be to just do some feedback techniques, and if that doesn't help, use the when necessary metoprolol.  15. Of relationship with alcohol, especially while in (red wine) is not too far faxed. The fact that symptoms abated shortly after she stopped drinking alcohol would suggest a possible correlation.      POTS (postural orthostatic tachycardia syndrome) (Chronic)    Controlled on Toprol since at least 2013. We talked about the importance of adequate hydration and  potentially use of support stockings etc. She really says that this symptom has been well controlled.         Current medicines are reviewed at length with the patient today. (+/- concerns) n/a The following changes have been made: don't be scared to use PRN BB.  Patient Instructions  IF you have spell like you described to Dr Herbie BaltimoreHARDING - YOU MAY USE YOUR AS NEEDED METOPROLOL    NO OTHER CHANGES AT PRESENT TIME    Your physician wants you to follow-up in 12 MONTHS WITH DR HARDING. You will receive a reminder letter in the mail two months in advance. If you don't receive a letter, please call our office to schedule the follow-up appointment.   If you need a refill on your cardiac medications before your next appointment, please call your pharmacy.    Studies Ordered:   No orders of the defined types were placed in this encounter.    Bryan Lemmaavid Harding, M.D., M.S. Interventional Cardiologist   Pager # [phone] Phone # [phone] [address] [place], [place] [address]

## 2016-09-17 NOTE — Assessment & Plan Note (Signed)
Controlled on Toprol since at least 2013. We talked about the importance of adequate hydration and potentially use of support stockings etc. She really says that this symptom has been well controlled.

## 2016-09-17 NOTE — Assessment & Plan Note (Addendum)
I think what she was feeling sounds very consistent with bigeminy. She didn't really describe as a rapid heartbeat which was what she felt with POTTS. She really didn't fear like she had near syncope or syncope, just a little bit dizzy. The episodes didn't last more in 3-4 minutes. We talked about some vagal maneuvers but it doesn't sound like SVT or A. fib. My recommendation would be to just do some feedback techniques, and if that doesn't help, use the when necessary metoprolol.  15. Of relationship with alcohol, especially while in (red wine) is not too far faxed. The fact that symptoms abated shortly after she stopped drinking alcohol would suggest a possible correlation.

## 2017-05-28 ENCOUNTER — Telehealth: Payer: Self-pay | Admitting: Internal Medicine

## 2017-05-28 MED ORDER — METOPROLOL SUCCINATE ER 25 MG PO TB24: 25.0000 mg | ORAL_TABLET | Freq: Every day | ORAL | 0 refills | Status: DC

## 2017-05-28 NOTE — Telephone Encounter (Signed)
Pt called for a refill of her metoprolol succinate (TOPROL-XL) 25 MG 24 hr tablet  CPE appt made for 11/26 w/ Plot Please Advise

## 2017-05-28 NOTE — Telephone Encounter (Signed)
Per office policy sent 30 day to local pharmacy until appt.../lmb  

## 2017-06-08 ENCOUNTER — Ambulatory Visit (INDEPENDENT_AMBULATORY_CARE_PROVIDER_SITE_OTHER): Payer: Managed Care, Other (non HMO) | Admitting: Internal Medicine

## 2017-06-08 ENCOUNTER — Encounter: Payer: Self-pay | Admitting: Internal Medicine

## 2017-06-08 VITALS — BP 110/76 | HR 77 | Temp 98.0°F | Ht 64.0 in | Wt 170.0 lb

## 2017-06-08 DIAGNOSIS — R002 Palpitations: Secondary | ICD-10-CM | POA: Diagnosis not present

## 2017-06-08 DIAGNOSIS — Z0000 Encounter for general adult medical examination without abnormal findings: Secondary | ICD-10-CM | POA: Diagnosis not present

## 2017-06-08 DIAGNOSIS — J069 Acute upper respiratory infection, unspecified: Secondary | ICD-10-CM | POA: Insufficient documentation

## 2017-06-08 DIAGNOSIS — Z800 Family history of malignant neoplasm of digestive organs: Secondary | ICD-10-CM | POA: Insufficient documentation

## 2017-06-08 MED ORDER — METOPROLOL SUCCINATE ER 25 MG PO TB24: 25.0000 mg | ORAL_TABLET | Freq: Every day | ORAL | 3 refills | Status: DC

## 2017-06-08 MED ORDER — VITAMIN D3 125 MCG (5000 UT) PO CAPS: ORAL_CAPSULE | ORAL | 11 refills | Status: DC

## 2017-06-08 MED ORDER — AZITHROMYCIN 250 MG PO TABS: ORAL_TABLET | ORAL | 0 refills | Status: DC

## 2017-06-08 NOTE — Assessment & Plan Note (Addendum)
Discussed early screening GI ref to discuss colon tests

## 2017-06-08 NOTE — Assessment & Plan Note (Signed)
On Toprol XL 

## 2017-06-08 NOTE — Assessment & Plan Note (Signed)
We discussed age appropriate health related issues, including available/recomended screening tests and vaccinations. We discussed a need for adhering to healthy diet and exercise. Labs were ordered to be later reviewed . All questions were answered.   

## 2017-06-08 NOTE — Progress Notes (Signed)
Subjective:  Patient ID: Pamela Thompson, female    DOB: September 06, 1975  Age: 41 y.o. MRN: [id number]  CC: No chief complaint on file.   HPI MILAN CARPINELLI presents for a well exam C/o URI sx's  Outpatient Medications Prior to Visit  Medication Sig Dispense Refill  . metoprolol succinate (TOPROL-XL) 25 MG 24 hr tablet Take 1 tablet (25 mg total) daily by mouth. Must keep schedule appt for future refills 30 tablet 0  . metoprolol tartrate (LOPRESSOR) 25 MG tablet Take 0.5 tablets (12.5 mg total) by mouth as needed (palpitations). 45 tablet 0   No facility-administered medications prior to visit.     ROS Review of Systems  Constitutional: Negative for activity change, appetite change, chills, fatigue and unexpected weight change.  HENT: Positive for voice change. Negative for congestion, mouth sores and sinus pressure.   Eyes: Negative for visual disturbance.  Respiratory: Positive for cough. Negative for chest tightness.   Cardiovascular: Positive for palpitations.  Gastrointestinal: Negative for abdominal pain and nausea.  Genitourinary: Negative for difficulty urinating, frequency and vaginal pain.  Musculoskeletal: Negative for back pain and gait problem.  Skin: Negative for pallor and rash.  Neurological: Negative for dizziness, tremors, weakness, numbness and headaches.  Psychiatric/Behavioral: Negative for confusion and sleep disturbance.    Objective:  BP 110/76 (BP Location: Left Arm, Patient Position: Sitting, Cuff Size: Large)   Pulse 77   Temp 98 F (36.7 C) (Oral)   Ht 5\' 4"  (1.626 m)   Wt 170 lb (77.1 kg)   SpO2 97%   BMI 29.18 kg/m   BP Readings from Last 3 Encounters:  06/08/17 110/76  09/16/16 110/60  07/29/16 104/72    Wt Readings from Last 3 Encounters:  06/08/17 170 lb (77.1 kg)  09/16/16 171 lb (77.6 kg)  07/29/16 168 lb (76.2 kg)    Physical Exam  Constitutional: She appears well-developed. No distress.  HENT:  Head: Normocephalic.  Right  Ear: External ear normal.  Left Ear: External ear normal.  Nose: Nose normal.  Mouth/Throat: Oropharynx is clear and moist.  Eyes: Conjunctivae are normal. Pupils are equal, round, and reactive to light. Right eye exhibits no discharge. Left eye exhibits no discharge.  Neck: Normal range of motion. Neck supple. No JVD present. No tracheal deviation present. No thyromegaly present.  Cardiovascular: Normal rate, regular rhythm and normal heart sounds.  Pulmonary/Chest: No stridor. No respiratory distress. She has no wheezes.  Abdominal: Soft. Bowel sounds are normal. She exhibits no distension and no mass. There is no tenderness. There is no rebound and no guarding.  Musculoskeletal: She exhibits no edema or tenderness.  Lymphadenopathy:    She has no cervical adenopathy.  Neurological: She displays normal reflexes. No cranial nerve deficit. She exhibits normal muscle tone. Coordination normal.  Skin: No rash noted. No erythema.  Psychiatric: She has a normal mood and affect. Her behavior is normal. Judgment and thought content normal.  hoarse  Lab Results  Component Value Date   WBC 6.9 05/30/2016   HGB 14.2 07/24/2016   HCT 41.2 07/24/2016   PLT 231.0 05/30/2016   GLUCOSE 101 (H) 07/24/2016   CHOL 174 05/30/2016   TRIG 78.0 05/30/2016   HDL 52.90 05/30/2016   LDLCALC 105 (H) 05/30/2016   ALT 13 05/30/2016   AST 20 05/30/2016   NA 141 07/24/2016   K 4.2 07/24/2016   CL 102 07/24/2016   CREATININE 0.78 07/24/2016   BUN 9 07/24/2016   CO2 32  07/24/2016   TSH 2.74 05/30/2016    No results found.  Assessment & Plan:   There are no diagnoses linked to this encounter. I have discontinued Hazelgrace T. Courser's metoprolol tartrate. I am also having her maintain her metoprolol succinate.  No orders of the defined types were placed in this encounter.    Follow-up: No Follow-up on file.  Sonda Primes, MD

## 2017-06-11 ENCOUNTER — Other Ambulatory Visit: Payer: Self-pay | Admitting: Internal Medicine

## 2017-06-11 ENCOUNTER — Other Ambulatory Visit (INDEPENDENT_AMBULATORY_CARE_PROVIDER_SITE_OTHER): Payer: Managed Care, Other (non HMO)

## 2017-06-11 DIAGNOSIS — Z0000 Encounter for general adult medical examination without abnormal findings: Secondary | ICD-10-CM

## 2017-06-11 LAB — HEPATIC FUNCTION PANEL
ALK PHOS: 47 U/L (ref 39–117)
ALT: 11 U/L (ref 0–35)
AST: 15 U/L (ref 0–37)
Albumin: 4.4 g/dL (ref 3.5–5.2)
BILIRUBIN DIRECT: 0 mg/dL (ref 0.0–0.3)
BILIRUBIN TOTAL: 0.4 mg/dL (ref 0.2–1.2)
Total Protein: 7.8 g/dL (ref 6.0–8.3)

## 2017-06-11 LAB — CBC WITH DIFFERENTIAL/PLATELET
BASOS ABS: 0 10*3/uL (ref 0.0–0.1)
Basophils Relative: 0.4 % (ref 0.0–3.0)
EOS ABS: 0.3 10*3/uL (ref 0.0–0.7)
Eosinophils Relative: 4.2 % (ref 0.0–5.0)
HCT: 42 % (ref 36.0–46.0)
HEMOGLOBIN: 14 g/dL (ref 12.0–15.0)
Lymphocytes Relative: 34.5 % (ref 12.0–46.0)
Lymphs Abs: 2.2 10*3/uL (ref 0.7–4.0)
MCHC: 33.4 g/dL (ref 30.0–36.0)
MCV: 94.2 fl (ref 78.0–100.0)
MONO ABS: 0.4 10*3/uL (ref 0.1–1.0)
Monocytes Relative: 6.8 % (ref 3.0–12.0)
Neutro Abs: 3.5 10*3/uL (ref 1.4–7.7)
Neutrophils Relative %: 54.1 % (ref 43.0–77.0)
Platelets: 277 10*3/uL (ref 150.0–400.0)
RBC: 4.46 Mil/uL (ref 3.87–5.11)
RDW: 12.4 % (ref 11.5–15.5)
WBC: 6.5 10*3/uL (ref 4.0–10.5)

## 2017-06-11 LAB — LIPID PANEL
CHOL/HDL RATIO: 4
Cholesterol: 180 mg/dL (ref 0–200)
HDL: 44.6 mg/dL (ref >39.00)
LDL CALC: 116 mg/dL — AB (ref 0–99)
NONHDL: 135.53
Triglycerides: 98 mg/dL (ref 0.0–149.0)
VLDL: 19.6 mg/dL (ref 0.0–40.0)

## 2017-06-11 LAB — BASIC METABOLIC PANEL
BUN: 13 mg/dL (ref 6–23)
CHLORIDE: 101 meq/L (ref 96–112)
CO2: 28 mEq/L (ref 19–32)
Calcium: 10.1 mg/dL (ref 8.4–10.5)
Creatinine, Ser: 0.78 mg/dL (ref 0.40–1.20)
GFR: 85.75 mL/min (ref >60.00)
GLUCOSE: 95 mg/dL (ref 70–99)
POTASSIUM: 3.9 meq/L (ref 3.5–5.1)
Sodium: 138 mEq/L (ref 135–145)

## 2017-06-11 LAB — VITAMIN D 25 HYDROXY (VIT D DEFICIENCY, FRACTURES): VITD: 26.11 ng/mL — ABNORMAL LOW (ref 30.00–100.00)

## 2017-06-11 LAB — URINALYSIS
Bilirubin Urine: NEGATIVE
HGB URINE DIPSTICK: NEGATIVE
KETONES UR: NEGATIVE
LEUKOCYTES UA: NEGATIVE
Nitrite: NEGATIVE
Specific Gravity, Urine: 1.01 (ref 1.000–1.030)
Total Protein, Urine: NEGATIVE
URINE GLUCOSE: NEGATIVE
UROBILINOGEN UA: 0.2 (ref 0.0–1.0)
pH: 6.5 (ref 5.0–8.0)

## 2017-06-11 LAB — TSH: TSH: 2.74 u[IU]/mL (ref 0.35–4.50)

## 2017-06-11 MED ORDER — VITAMIN D3 1.25 MG (50000 UT) PO CAPS: 1.0000 | ORAL_CAPSULE | ORAL | 0 refills | Status: DC

## 2017-06-12 ENCOUNTER — Encounter: Payer: Self-pay | Admitting: Internal Medicine

## 2017-07-14 HISTORY — PX: COLONOSCOPY: SHX174

## 2017-07-20 ENCOUNTER — Encounter: Payer: Self-pay | Admitting: Internal Medicine

## 2017-07-20 ENCOUNTER — Encounter (INDEPENDENT_AMBULATORY_CARE_PROVIDER_SITE_OTHER): Payer: Self-pay

## 2017-07-20 ENCOUNTER — Ambulatory Visit (INDEPENDENT_AMBULATORY_CARE_PROVIDER_SITE_OTHER): Payer: Managed Care, Other (non HMO) | Admitting: Internal Medicine

## 2017-07-20 VITALS — BP 116/88 | HR 88 | Ht 63.5 in | Wt 169.2 lb

## 2017-07-20 DIAGNOSIS — Z800 Family history of malignant neoplasm of digestive organs: Secondary | ICD-10-CM | POA: Diagnosis not present

## 2017-07-20 DIAGNOSIS — K649 Unspecified hemorrhoids: Secondary | ICD-10-CM

## 2017-07-20 DIAGNOSIS — R1310 Dysphagia, unspecified: Secondary | ICD-10-CM

## 2017-07-20 DIAGNOSIS — R1319 Other dysphagia: Secondary | ICD-10-CM

## 2017-07-20 NOTE — Progress Notes (Signed)
Pamela Thompson 42 y.o. [birth year] [id number] Referred by: Tresa Garter, MD  Assessment & Plan:   Encounter Diagnoses  Name Primary?  . Esophageal dysphagia Yes  . Family history of colon cancer   . Bleeding hemorrhoids - suspected     Further dysphagia I have recommended an upper GI endoscopy and possible esophageal dilation.The risks and benefits as well as alternatives of endoscopic procedure(s) have been discussed and reviewed. All questions answered. The patient agrees to proceed. We discussed PPI therapy and the weak associations with some condition such as bone fractures kidney failure and dementia but that that does not prove cause and effect and she may be someone who is better off taking a chronic PPI.  She accepts this argument and we will determine that after the endoscopy.  She is reducing caffeine in particular because it aggravates her postural orthostatic syndrome  Colonoscopy will be scheduled for her family history of colon cancer with 2 relatives having that even though they were in their 8s I think it makes sense to perform a screening colonoscopy  The risks and benefits as well as alternatives of endoscopic procedure(s) have been discussed and reviewed. All questions answered. The patient agrees to proceed.  I appreciate the opportunity to care for this patient. 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  Her mother had colon cancer resected for cure at about age 60 and her maternal grandmother had advanced metastatic disease that took her life in her late 102's.  A few weeks ago she saw just a slight bit of red blood with wiping.  GI review of systems otherwise negative for lower abdominal or GI problems.  She also has a history of reflux, and she is  underwent an endoscopy when she was living in [place]. [place] and apparently had some reflux changes.  She took a PPI for a while but then when she learned about the reports of problems with PPIs decided to come off of that and is been using antacid tablets pretty regularly.  She has had 3 episodes of dysphagia to within the past few months to solid foods one was to [place] fries and the other was the chicken with a midsternal sticking point.  About 18 months ago she had a severe episode of dysphagia and was spitting up or regurgitating with drinking some soda.  She does not have unintentional weight loss.  GI review of systems is otherwise negative. Allergies  Allergen Reactions  . Minocycline Hives    Can take a Zpac  . Penicillins    Current Meds  Medication Sig  . Cholecalciferol (VITAMIN D3) 50000 units CAPS Take 1 capsule by mouth once a week.  . metoprolol succinate (TOPROL-XL) 25 MG 24 hr tablet Take 1 tablet (25 mg total) by mouth daily. Must keep schedule appt for future refills   Past Medical History:  Diagnosis Date  . GERD (gastroesophageal reflux disease)   . Hx of blood clots    uterine  . Maternal APL (antiphospholipid antibody syndrome) (HCC)   . POTS (postural orthostatic tachycardia syndrome)    Past Surgical History:  Procedure Laterality Date  . ESOPHAGOGASTRODUODENOSCOPY     2013 - [place]  . LASIK Bilateral   . 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   Review of Systems As per HPI.  Some recent trouble with cough sore throat and some minor  epistaxis.  Also complains of sinus and allergy problems.  Objective:   Physical Exam @BP  116/88 (BP Location: Left Arm, Patient Position: Sitting, Cuff Size: Normal)   Pulse 88   Ht 5' 3.5" (1.613 m) Comment: height measured without shoes  Wt 169 lb 4 oz (76.8 kg)   LMP 06/29/2017   BMI 29.51 kg/m @  General:  Well-developed, well-nourished and in no acute distress Eyes:  anicteric. ENT:   Mouth and posterior pharynx free of lesions.  Neck:   supple w/o thyromegaly or mass.  Lungs: Clear to auscultation bilaterally. Heart:  S1S2, no rubs, murmurs, gallops. Abdomen:  soft, non-tender, no hepatosplenomegaly, hernia, or mass and BS+.  Rectal: Deferred Lymph:  no cervical or supraclavicular adenopathy. Extremities:   no edema, cyanosis or clubbing Skin   no rash. Neuro:  A&O x 3.  Psych:  appropriate mood and  Affect.   Data Reviewed:  November 2018 labs were normal CBC chemistries and TSH.

## 2017-07-20 NOTE — Patient Instructions (Signed)
  You have been scheduled for an endoscopy and colonoscopy. Please follow the written instructions given to you at your visit today. Please pick up your prep supplies at the pharmacy. If you use inhalers (even only as needed), please bring them with you on the day of your procedure. Your physician has requested that you go to www.[email or web address].com and enter the access code given to you at your visit today. This web site gives a general overview about your procedure. However, you should still follow specific instructions given to you by our office regarding your preparation for the procedure.    I appreciate the opportunity to care for you. Carl Gessner, MD, FACG 

## 2017-07-21 ENCOUNTER — Encounter: Payer: Self-pay | Admitting: Internal Medicine

## 2017-07-23 ENCOUNTER — Telehealth: Payer: Self-pay | Admitting: Internal Medicine

## 2017-07-23 NOTE — Telephone Encounter (Signed)
No Charge 

## 2017-07-27 ENCOUNTER — Encounter: Payer: Managed Care, Other (non HMO) | Admitting: Internal Medicine

## 2017-07-30 ENCOUNTER — Encounter: Payer: Self-pay | Admitting: Internal Medicine

## 2017-07-31 ENCOUNTER — Encounter: Payer: Self-pay | Admitting: Internal Medicine

## 2017-07-31 ENCOUNTER — Ambulatory Visit (AMBULATORY_SURGERY_CENTER): Payer: Managed Care, Other (non HMO) | Admitting: Internal Medicine

## 2017-07-31 ENCOUNTER — Other Ambulatory Visit: Payer: Self-pay

## 2017-07-31 VITALS — BP 97/58 | HR 72 | Temp 98.4°F | Resp 24 | Ht 63.5 in | Wt 169.0 lb

## 2017-07-31 DIAGNOSIS — Z1212 Encounter for screening for malignant neoplasm of rectum: Secondary | ICD-10-CM | POA: Diagnosis not present

## 2017-07-31 DIAGNOSIS — K317 Polyp of stomach and duodenum: Secondary | ICD-10-CM

## 2017-07-31 DIAGNOSIS — R1310 Dysphagia, unspecified: Secondary | ICD-10-CM | POA: Diagnosis not present

## 2017-07-31 DIAGNOSIS — Z800 Family history of malignant neoplasm of digestive organs: Secondary | ICD-10-CM

## 2017-07-31 DIAGNOSIS — K209 Esophagitis, unspecified: Secondary | ICD-10-CM | POA: Diagnosis not present

## 2017-07-31 DIAGNOSIS — Z1211 Encounter for screening for malignant neoplasm of colon: Secondary | ICD-10-CM

## 2017-07-31 DIAGNOSIS — R1319 Other dysphagia: Secondary | ICD-10-CM

## 2017-07-31 MED ORDER — SODIUM CHLORIDE 0.9 % IV SOLN: 500.0000 mL | Freq: Once | INTRAVENOUS | Status: DC

## 2017-07-31 NOTE — Op Note (Signed)
[facility] Patient Name: Pamela Thompson Procedure Date: 07/31/2017 10:42 AM MRN: [id number] Endoscopist: Iva Boop , MD Age: 42 Referring MD:  Date of Birth: 16-May-1976 Gender: Female Account #: [id number] Procedure:                Colonoscopy Indications:              Screening in patient at increased risk: Colorectal                            cancer in mother 61 or older and maternal                            grandmother in 19's Medicines:                Propofol per Anesthesia, Monitored Anesthesia Care Procedure:                Pre-Anesthesia Assessment:                           - Prior to the procedure, a History and Physical                            was performed, and patient medications and                            allergies were reviewed. The patient's tolerance of                            previous anesthesia was also reviewed. The risks                            and benefits of the procedure and the sedation                            options and risks were discussed with the patient.                            All questions were answered, and informed consent                            was obtained. Prior Anticoagulants: The patient has                            taken no previous anticoagulant or antiplatelet                            agents. ASA Grade Assessment: II - A patient with                            mild systemic disease. After reviewing the risks                            and benefits, the patient was deemed in  satisfactory condition to undergo the procedure.                           After obtaining informed consent, the colonoscope                            was passed under direct vision. Throughout the                            procedure, the patient's blood pressure, pulse, and                            oxygen saturations were monitored continuously. The                            Colonoscope was  introduced through the anus and                            advanced to the the cecum, identified by                            appendiceal orifice and ileocecal valve. The                            colonoscopy was performed without difficulty. The                            patient tolerated the procedure well. The quality                            of the bowel preparation was good. The ileocecal                            valve, appendiceal orifice, and rectum were                            photographed. The bowel preparation used was                            Miralax. Scope In: 11:03:11 AM Scope Out: 11:16:15 AM Scope Withdrawal Time: 0 hours 8 minutes 31 seconds  Total Procedure Duration: 0 hours 13 minutes 4 seconds  Findings:                 The perianal and digital rectal examinations were                            normal.                           The colon (entire examined portion) appeared normal.                           The retroflexed view of the distal rectum and anal  verge was normal and showed no anal or rectal                            abnormalities. Complications:            No immediate complications. Estimated Blood Loss:     Estimated blood loss: none. Impression:               - The entire examined colon is normal.                           - The distal rectum and anal verge are normal on                            retroflexion view.                           - No specimens collected. Recommendation:           - Patient has a contact number available for                            emergencies. The signs and symptoms of potential                            delayed complications were discussed with the                            patient. Return to normal activities tomorrow.                            Written discharge instructions were provided to the                            patient.                           - Resume previous  diet.                           - Continue present medications.                           - Repeat colonoscopy in 5 years for screening                            purposes. Mother and maternal grandmother had colon                            cancer. Iva Boop, MD 07/31/2017 11:30:50 AM This report has been signed electronically.

## 2017-07-31 NOTE — Progress Notes (Signed)
To recovery, report to RN, VSS. 

## 2017-07-31 NOTE — Op Note (Signed)
[facility] Patient Name: Pamela Thompson Procedure Date: 07/31/2017 10:43 AM MRN: [id number] Endoscopist: Iva Boop , MD Age: 42 Referring MD:  Date of Birth: 01/18/1976 Gender: Female Account #: [id number] Procedure:                Upper GI endoscopy Indications:              Dysphagia Medicines:                Propofol per Anesthesia, Monitored Anesthesia Care Procedure:                Pre-Anesthesia Assessment:                           - Prior to the procedure, a History and Physical                            was performed, and patient medications and                            allergies were reviewed. The patient's tolerance of                            previous anesthesia was also reviewed. The risks                            and benefits of the procedure and the sedation                            options and risks were discussed with the patient.                            All questions were answered, and informed consent                            was obtained. Prior Anticoagulants: The patient has                            taken no previous anticoagulant or antiplatelet                            agents. ASA Grade Assessment: II - A patient with                            mild systemic disease. After reviewing the risks                            and benefits, the patient was deemed in                            satisfactory condition to undergo the procedure.                           After obtaining informed consent, the endoscope was  passed under direct vision. Throughout the                            procedure, the patient's blood pressure, pulse, and                            oxygen saturations were monitored continuously. The                            Endoscope was introduced through the mouth, and                            advanced to the second part of duodenum. The upper                            GI endoscopy was  accomplished without difficulty.                            The patient tolerated the procedure well. Scope In: Scope Out: Findings:                 Mucosal changes including feline appearance and                            longitudinal furrows were found in the entire                            esophagus. Esophageal findings were graded using                            the Eosinophilic Esophagitis Endoscopic Reference                            Score (EoE-EREFS) as: Edema Grade 1 Present                            (decreased clarity or absence of vascular                            markings), Rings Grade 1 Mild (subtle                            circumferential ridges seen on esophageal                            distension), Exudates Grade 1 Mild (scattered white                            lesions involving less than 10 percent of the                            esophageal surface area), Furrows Grade 1 Present                            (vertical lines  with or without visible depth) and                            Stricture none (no stricture found). Biopsies were                            obtained from the proximal and distal esophagus                            with cold forceps for histology of suspected                            eosinophilic esophagitis. Verification of patient                            identification for the specimen was done. Estimated                            blood loss was minimal.                           A single diminutive sessile polyp with no stigmata                            of recent bleeding was found in the cardia.                            Biopsies were taken with a cold forceps for                            histology. Verification of patient identification                            for the specimen was done. Estimated blood loss was                            minimal.                           The exam was otherwise without abnormality.                            The cardia and gastric fundus were normal on                            retroflexion. Complications:            No immediate complications. Estimated Blood Loss:     Estimated blood loss was minimal. Impression:               - Esophageal mucosal changes suggestive of                            eosinophilic esophagitis. Biopsied. No dilation per  discussion with patient                           - A single gastric polyp. Biopsied. Looks like it                            was removed                           - The examination was otherwise normal. Recommendation:           - Patient has a contact number available for                            emergencies. The signs and symptoms of potential                            delayed complications were discussed with the                            patient. Return to normal activities tomorrow.                            Written discharge instructions were provided to the                            patient.                           - Resume previous diet.                           - Continue present medications.                           - Await pathology results.                           - See the other procedure note for documentation of                            additional recommendations. Iva Boop, MD 07/31/2017 11:27:02 AM This report has been signed electronically.

## 2017-07-31 NOTE — Progress Notes (Signed)
Patient consents to observer being present for procedure.   

## 2017-07-31 NOTE — Progress Notes (Signed)
Dx with a sinus infection around Dec 1, st.  Pt took a z-pac.  No sx since completing antibiotic per pt. maw

## 2017-07-31 NOTE — Progress Notes (Signed)
Called to room to assist during endoscopic procedure.  Patient ID and intended procedure confirmed with present staff. Received instructions for my participation in the procedure from the performing physician.  

## 2017-07-31 NOTE — Patient Instructions (Addendum)
It looks like you may have a condition called eosinophilic esophagitis. In eosinophilic esophagitis (e-[id number]-FILL-ik uh-[id number]-tis), a type of white blood cell (eosinophil) builds up in the lining of the tube that connects your mouth to your stomach (esophagus). This buildup, which is a reaction to foods, allergens or acid reflux, can inflame or injure the esophageal tissue. Damaged esophageal tissue can lead to difficulty swallowing or cause food to get caught when you swallow.  Eosinophilic esophagitis is a chronic immune system disease. It has been identified only in the past two decades, but is now considered a major cause of digestive system (gastrointestinal) illness. Research is ongoing and will likely lead to revisions in its diagnosis and treatment.  I took biopsies to see if you have this.  It will often respond to PPI therapy. Sometimes we use swallowed steroids temporarily. Dilation may be necessary (not done today).  There was a small stomach polyp removed that also looks benign.  The colonoscopy was normal. Your next routine colonoscopy should be in 5 years - 2024.  I appreciate the opportunity to care for you. Iva Booparl E. Fredrico Beedle, MD, FACG   YOU HAD AN ENDOSCOPIC PROCEDURE TODAY AT THE [facility]:   Refer to the procedure report that was given to you for any specific questions about what was found during the examination.  If the procedure report does not answer your questions, please call your gastroenterologist to clarify.  If you requested that your care partner not be given the details of your procedure findings, then the procedure report has been included in a sealed envelope for you to review at your convenience later.  YOU SHOULD EXPECT: Some feelings of bloating in the abdomen. Passage of more gas than usual.  Walking can help get rid of the air that was put into your GI tract during the procedure and reduce the bloating. If you had a lower endoscopy  (such as a colonoscopy or flexible sigmoidoscopy) you may notice spotting of blood in your stool or on the toilet paper. If you underwent a bowel prep for your procedure, you may not have a normal bowel movement for a few days.  Please Note:  You might notice some irritation and congestion in your nose or some drainage.  This is from the oxygen used during your procedure.  There is no need for concern and it should clear up in a day or so.  SYMPTOMS TO REPORT IMMEDIATELY:   Following lower endoscopy (colonoscopy or flexible sigmoidoscopy):  Excessive amounts of blood in the stool  Significant tenderness or worsening of abdominal pains  Swelling of the abdomen that is new, acute  Fever of 100F or higher   Following upper endoscopy (EGD)  Vomiting of blood or coffee ground material  New chest pain or pain under the shoulder blades  Painful or persistently difficult swallowing  New shortness of breath  Fever of 100F or higher  Black, tarry-looking stools  For urgent or emergent issues, a gastroenterologist can be reached at any hour by calling [phone].   DIET:  We do recommend a small meal at first, but then you may proceed to your regular diet.  Drink plenty of fluids but you should avoid alcoholic beverages for 24 hours.  ACTIVITY:  You should plan to take it easy for the rest of today and you should NOT DRIVE or use heavy machinery until tomorrow (because of the sedation medicines used during the test).    FOLLOW UP: Our staff  will call the number listed on your records the next business day following your procedure to check on you and address any questions or concerns that you may have regarding the information given to you following your procedure. If we do not reach you, we will leave a message.  However, if you are feeling well and you are not experiencing any problems, there is no need to return our call.  We will assume that you have returned to your regular daily  activities without incident.  If any biopsies were taken you will be contacted by phone or by letter within the next 1-3 weeks.  Please call us at [phone] if you have not heard about the biopsies in 3 weeks.    SIGNATURES/CONFIDENTIALITY: You and/or your care partner have signed paperwork which will be entered into your electronic medical record.  These signatures attest to the fact that that the information above on your After Visit Summary has been reviewed and is understood.  Full responsibility of the confidentiality of this discharge information lies with you and/or your care-partner.   Thank you for allowing [place] to provide your healthcare today.

## 2017-08-03 ENCOUNTER — Telehealth: Payer: Self-pay

## 2017-08-03 NOTE — Telephone Encounter (Signed)
  Follow up Call-  Call back number 07/31/2017  Post procedure Call Back phone  # #[phone] cell  Permission to leave phone message Yes  Some recent data might be hidden     Patient questions:  Do you have a fever, pain , or abdominal swelling? No. Pain Score  0 *  Have you tolerated food without any problems? Yes.    Have you been able to return to your normal activities? Yes.    Do you have any questions about your discharge instructions: Diet   No. Medications  No. Follow up visit  No.  Do you have questions or concerns about your Care? No.  Actions: * If pain score is 4 or above: No action needed, pain <4.  Pt did report she has a sensation in her esophagus that she occasionally has felt when swallowing meat in the past, but after her EGD she has felt this sensation with meat and liquids.  She did say it was better and will see if it continues and if so she will call us back.  maw

## 2017-08-03 NOTE — Telephone Encounter (Signed)
Called [phone] and left a messaged we tried to reach pt for a follow up call. maw

## 2017-08-05 ENCOUNTER — Encounter: Payer: Self-pay | Admitting: Internal Medicine

## 2017-08-05 DIAGNOSIS — K200 Eosinophilic esophagitis: Secondary | ICD-10-CM

## 2017-08-05 HISTORY — DX: Eosinophilic esophagitis: K20.0

## 2017-08-05 NOTE — Progress Notes (Signed)
[facility] - no letter or recall for EGD - should already have a 5 year colon recall  Office - please tell her she has eosinophilic esophagitis as I suspected.  Would start pantoprazole 40 mg qd before breakfast # 90 and 3 RF  Schedule a 2-3 month f/u appointment please

## 2017-08-06 ENCOUNTER — Other Ambulatory Visit: Payer: Self-pay

## 2017-08-06 MED ORDER — PANTOPRAZOLE SODIUM 40 MG PO TBEC: 40.0000 mg | DELAYED_RELEASE_TABLET | Freq: Every day | ORAL | 3 refills | Status: DC

## 2017-10-13 ENCOUNTER — Encounter: Payer: Self-pay | Admitting: Internal Medicine

## 2017-10-13 ENCOUNTER — Encounter (INDEPENDENT_AMBULATORY_CARE_PROVIDER_SITE_OTHER): Payer: Self-pay

## 2017-10-13 ENCOUNTER — Ambulatory Visit (INDEPENDENT_AMBULATORY_CARE_PROVIDER_SITE_OTHER): Payer: Managed Care, Other (non HMO) | Admitting: Internal Medicine

## 2017-10-13 VITALS — BP 110/70 | HR 80 | Ht 63.5 in | Wt 170.0 lb

## 2017-10-13 DIAGNOSIS — K200 Eosinophilic esophagitis: Secondary | ICD-10-CM | POA: Diagnosis not present

## 2017-10-13 MED ORDER — AMBULATORY NON FORMULARY MEDICATION: 0 refills | Status: DC

## 2017-10-13 NOTE — Progress Notes (Signed)
   Pamela Thompson 42 y.o. 05/09/1976 [id number]  Assessment & Plan:   Encounter Diagnosis  Name Primary?  . Eosinophilic esophagitis Yes   She is improved but still symptomatic with some dysphagia.  He did not want dilation and she is overall reluctant to take chronic medication but will take the PPI.  I discussed the potential benefits of topical ingested budesonide and she is agreed to take that for 1 month and me know if that resolves things.  She could potentially need that for a longer course but I will wait to hear from her.  Otherwise would anticipate seeing her in a year and stay on a PPI.  We did review food elimination diets and the difficulty of those, and my clinical experience with lack of success with allergy evaluation.  I appreciate the opportunity to care for this patient. CC: Plotnikov, Georgina QuintAleksei V, MD   Subjective:   Chief Complaint: Follow-up of eosinophilic esophagitis  HPI S/p EGD and no dilation - bxs showed EoE July 31, 2017.  PPI started after that pantoprazole 40 mg daily. Overall much better but still dysphagia to hard bread, opancakes, dry meats. No heartburn sxs.   Allergies  Allergen Reactions  . Minocycline Hives    Can take a Zpac  . Penicillins     Pt does not know reaction   Current Meds  Medication Sig  . Cholecalciferol (VITAMIN D3) 2000 units TABS Take 1 tablet by mouth daily.  . Dapsone (ACZONE) 5 % topical gel Apply topically daily.  . metoprolol succinate (TOPROL-XL) 25 MG 24 hr tablet Take 1 tablet (25 mg total) by mouth daily. Must keep schedule appt for future refills  . pantoprazole (PROTONIX) 40 MG tablet Take 1 tablet (40 mg total) by mouth daily before breakfast.   Past Medical History:  Diagnosis Date  . Allergy   . Clotting disorder (HCC)    2n pregancy intrauterinal blood clot  . Eosinophilic esophagitis 08/05/2017  . GERD (gastroesophageal reflux disease)   . Hx of blood clots    uterine  . Maternal APL  (antiphospholipid antibody syndrome) (HCC)   . POTS (postural orthostatic tachycardia syndrome)    Past Surgical History:  Procedure Laterality Date  . COLONOSCOPY  2019   Multiple due to family history of colon cancer so far negative  . ESOPHAGOGASTRODUODENOSCOPY     2013 - [place]  . LASIK Bilateral   . MOHS SURGERY     forhead  . 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   Review of Systems As per HPI.  Her screening colonoscopy was negative  Objective:   Physical Exam BP 110/70   Pulse 80   Ht 5' 3.5" (1.613 m)   Wt 170 lb (77.1 kg)   LMP 09/25/2017 (Approximate)   BMI 29.64 kg/m  No acute distress   15 minutes time spent with patient > half in counseling coordination of care

## 2017-10-13 NOTE — Patient Instructions (Signed)
  [facility] the [facility] for you to pick [facility] at your convenience: Budesonide, take this for a month and let us know if it helps you.  Call tomorrow before going to pick up.   Chew your food carefully.   Follow up with Dr Leone PayorGessner in a year.     I appreciate the opportunity to care for you. Stan Headarl Gessner, MD, [facility]

## 2017-10-15 ENCOUNTER — Encounter: Payer: Self-pay | Admitting: Internal Medicine

## 2017-10-28 ENCOUNTER — Ambulatory Visit: Payer: Self-pay

## 2017-10-28 NOTE — Telephone Encounter (Signed)
Pt. C/o right calf pain x 2 weeks on and off. More of a constant "ache the past 2 days." Denies any swelling or redness. States it has disrupted her sleep. States she had a uterine clot 11 years ago. Appointment ,ade for tomorrow. Instructed if pain increases, she has shortness of breath or chest pain to call 911 or go to ED. Verbalizes understanding.  Reason for Disposition . Localized pain, redness or hard lump along vein  Answer Assessment - Initial Assessment Questions 1. ONSET: "When did the pain start?"      Started 10 days ago 2. LOCATION: "Where is the pain located?"      Right calf 3. PAIN: "How bad is the pain?"    (Scale 1-10; or mild, moderate, severe)   -  MILD (1-3): doesn't interfere with normal activities    -  MODERATE (4-7): interferes with normal activities (e.g., work or school) or awakens from sleep, limping    -  SEVERE (8-10): excruciating pain, unable to do any normal activities, unable to walk     2 4. WORK OR EXERCISE: "Has there been any recent work or exercise that involved this part of the body?"      No 5. CAUSE: "What do you think is causing the leg pain?"     Unsure 6. OTHER SYMPTOMS: "Do you have any other symptoms?" (e.g., chest pain, back pain, breathing difficulty, swelling, rash, fever, numbness, weakness)     No 7. PREGNANCY: "Is there any chance you are pregnant?" "When was your last menstrual period?"     No  Protocols used: LEG PAIN-A-AH

## 2017-10-29 ENCOUNTER — Ambulatory Visit (INDEPENDENT_AMBULATORY_CARE_PROVIDER_SITE_OTHER): Payer: Managed Care, Other (non HMO) | Admitting: Internal Medicine

## 2017-10-29 ENCOUNTER — Encounter: Payer: Self-pay | Admitting: Internal Medicine

## 2017-10-29 DIAGNOSIS — M79661 Pain in right lower leg: Secondary | ICD-10-CM

## 2017-10-29 NOTE — Progress Notes (Signed)
   Subjective:    Patient ID: Pamela Thompson, female    DOB: 11/05/1975, 42 y.o.   MRN: [id number]  HPI The patient is a 42 YO female coming in for mild right calf pain. Started about 2 weeks ago. She denies injury or overuse. Is generally a runner but not running for other reasons much in the last 2 weeks. Has history of plantar fasciitis which is doing well lately. Denies change to medicines or diet. Does have some history of blood clot intrauterine during pregnancy. Treated with high dose folic acid. Has not needed or taken blood thinners chronically. Denies long travel in the last 2 months. Does not take any form of birth control (other than condoms). Denies swelling or rash. 1 episode of worse pain in the night which woke her and did not last long. Has not taken anything for it.   Review of Systems  Constitutional: Negative.   Respiratory: Negative for cough, chest tightness and shortness of breath.   Cardiovascular: Negative for chest pain, palpitations and leg swelling.  Gastrointestinal: Negative for abdominal distention, abdominal pain, constipation, diarrhea, nausea and vomiting.  Musculoskeletal: Positive for myalgias.  Skin: Negative.   Neurological: Negative.       Objective:   Physical Exam  Constitutional: She is oriented to person, place, and time. She appears well-developed and well-nourished.  HENT:  Head: Normocephalic and atraumatic.  Eyes: EOM are normal.  Neck: Normal range of motion.  Cardiovascular: Normal rate and regular rhythm.  Pulmonary/Chest: Effort normal and breath sounds normal. No respiratory distress. She has no wheezes. She has no rales.  Abdominal: Soft.  Musculoskeletal: She exhibits no edema or tenderness.  Neurological: She is alert and oriented to person, place, and time. Coordination normal.  Skin: Skin is warm and dry.   Vitals:   10/29/17 0848  BP: 100/70  Pulse: 75  Temp: 98.9 F (37.2 C)  TempSrc: Oral  SpO2: 96%  Weight: 169 lb  (76.7 kg)  Height: 5' 3.5" (1.613 m)      Assessment & Plan:

## 2017-10-29 NOTE — Patient Instructions (Signed)
We would recommend to take an aspirin 325 mg today, tomorrow, and Saturday.   Take a break at least every 3 hours while driving to walk around.

## 2017-10-29 NOTE — Assessment & Plan Note (Signed)
No evidence of DVT, suspect muscle pain. Could be related to lack of exercise recently. Offered CMP to check electrolytes today but she declines. Can take otc meds for pain if needed.

## 2018-06-14 ENCOUNTER — Encounter: Payer: Self-pay | Admitting: Internal Medicine

## 2018-06-14 ENCOUNTER — Other Ambulatory Visit (INDEPENDENT_AMBULATORY_CARE_PROVIDER_SITE_OTHER): Payer: Managed Care, Other (non HMO)

## 2018-06-14 ENCOUNTER — Ambulatory Visit (INDEPENDENT_AMBULATORY_CARE_PROVIDER_SITE_OTHER): Payer: Managed Care, Other (non HMO) | Admitting: Internal Medicine

## 2018-06-14 VITALS — BP 106/76 | HR 80 | Temp 98.5°F | Ht 63.5 in | Wt 170.0 lb

## 2018-06-14 DIAGNOSIS — D6861 Antiphospholipid syndrome: Secondary | ICD-10-CM

## 2018-06-14 DIAGNOSIS — Z0000 Encounter for general adult medical examination without abnormal findings: Secondary | ICD-10-CM

## 2018-06-14 DIAGNOSIS — G90A Postural orthostatic tachycardia syndrome (POTS): Secondary | ICD-10-CM

## 2018-06-14 DIAGNOSIS — R000 Tachycardia, unspecified: Secondary | ICD-10-CM

## 2018-06-14 DIAGNOSIS — Z0001 Encounter for general adult medical examination with abnormal findings: Secondary | ICD-10-CM | POA: Diagnosis not present

## 2018-06-14 DIAGNOSIS — N920 Excessive and frequent menstruation with regular cycle: Secondary | ICD-10-CM

## 2018-06-14 DIAGNOSIS — I951 Orthostatic hypotension: Secondary | ICD-10-CM

## 2018-06-14 LAB — URINALYSIS, ROUTINE W REFLEX MICROSCOPIC
Bilirubin Urine: NEGATIVE
Ketones, ur: NEGATIVE
Leukocytes, UA: NEGATIVE
Nitrite: NEGATIVE
Specific Gravity, Urine: <=1.005 — AB (ref 1.000–1.030)
Total Protein, Urine: NEGATIVE
Urine Glucose: NEGATIVE
Urobilinogen, UA: 0.2 (ref 0.0–1.0)
pH: 7 (ref 5.0–8.0)

## 2018-06-14 LAB — BASIC METABOLIC PANEL
BUN: 12 mg/dL (ref 6–23)
CHLORIDE: 102 meq/L (ref 96–112)
CO2: 26 mEq/L (ref 19–32)
Calcium: 9.9 mg/dL (ref 8.4–10.5)
Creatinine, Ser: 0.89 mg/dL (ref 0.40–1.20)
GFR: 73.29 mL/min (ref >60.00)
Glucose, Bld: 95 mg/dL (ref 70–99)
POTASSIUM: 4 meq/L (ref 3.5–5.1)
SODIUM: 138 meq/L (ref 135–145)

## 2018-06-14 LAB — CBC WITH DIFFERENTIAL/PLATELET
BASOS PCT: 0.3 % (ref 0.0–3.0)
Basophils Absolute: 0 10*3/uL (ref 0.0–0.1)
EOS ABS: 0.1 10*3/uL (ref 0.0–0.7)
Eosinophils Relative: 1.6 % (ref 0.0–5.0)
HEMATOCRIT: 38.1 % (ref 36.0–46.0)
Hemoglobin: 12.9 g/dL (ref 12.0–15.0)
Lymphocytes Relative: 34.7 % (ref 12.0–46.0)
Lymphs Abs: 2 10*3/uL (ref 0.7–4.0)
MCHC: 33.7 g/dL (ref 30.0–36.0)
MCV: 88.1 fl (ref 78.0–100.0)
Monocytes Absolute: 0.4 10*3/uL (ref 0.1–1.0)
Monocytes Relative: 6.7 % (ref 3.0–12.0)
Neutro Abs: 3.2 10*3/uL (ref 1.4–7.7)
Neutrophils Relative %: 56.7 % (ref 43.0–77.0)
Platelets: 305 10*3/uL (ref 150.0–400.0)
RBC: 4.33 Mil/uL (ref 3.87–5.11)
RDW: 13.9 % (ref 11.5–15.5)
WBC: 5.7 10*3/uL (ref 4.0–10.5)

## 2018-06-14 LAB — LIPID PANEL
Cholesterol: 226 mg/dL — ABNORMAL HIGH (ref 0–200)
HDL: 58.1 mg/dL (ref >39.00)
LDL Cholesterol: 153 mg/dL — ABNORMAL HIGH (ref 0–99)
NonHDL: 167.63
Total CHOL/HDL Ratio: 4
Triglycerides: 72 mg/dL (ref 0.0–149.0)
VLDL: 14.4 mg/dL (ref 0.0–40.0)

## 2018-06-14 LAB — HEPATIC FUNCTION PANEL
ALT: 14 U/L (ref 0–35)
AST: 19 U/L (ref 0–37)
Albumin: 4.8 g/dL (ref 3.5–5.2)
Alkaline Phosphatase: 48 U/L (ref 39–117)
Bilirubin, Direct: 0.1 mg/dL (ref 0.0–0.3)
Total Bilirubin: 0.8 mg/dL (ref 0.2–1.2)
Total Protein: 8 g/dL (ref 6.0–8.3)

## 2018-06-14 LAB — FOLLICLE STIMULATING HORMONE: FSH: 11.2 m[IU]/mL

## 2018-06-14 LAB — APTT: aPTT: 35.9 s — ABNORMAL HIGH (ref 23.4–32.7)

## 2018-06-14 LAB — PROTIME-INR
INR: 1 ratio (ref 0.8–1.0)
Prothrombin Time: 11.9 s (ref 9.6–13.1)

## 2018-06-14 LAB — TSH: TSH: 2.22 u[IU]/mL (ref 0.35–4.50)

## 2018-06-14 MED ORDER — ASPIRIN 325 MG PO TABS: ORAL_TABLET | ORAL | 3 refills | Status: DC

## 2018-06-14 MED ORDER — PANTOPRAZOLE SODIUM 40 MG PO TBEC: 40.0000 mg | DELAYED_RELEASE_TABLET | Freq: Every day | ORAL | 3 refills | Status: DC

## 2018-06-14 MED ORDER — METOPROLOL SUCCINATE ER 25 MG PO TB24: 25.0000 mg | ORAL_TABLET | Freq: Every day | ORAL | 3 refills | Status: DC

## 2018-06-14 NOTE — Assessment & Plan Note (Addendum)
No relapse 

## 2018-06-14 NOTE — Patient Instructions (Addendum)
Take Aspirin 325 mg for trips Ibuprofen 400-600 mg 2-3 times a day as needed

## 2018-06-14 NOTE — Assessment & Plan Note (Addendum)
S/p hem eval 2006 ASA 325 mg for trips Hem f/u w/Dr Starwood HotelsMagrinat

## 2018-06-14 NOTE — Assessment & Plan Note (Addendum)
Ibuprofen 400-600 mg 2-3 times a day as needed Dr [facility]

## 2018-06-14 NOTE — Progress Notes (Signed)
Subjective:  Patient ID: Pamela Thompson, female    DOB: 04/27/1976  Age: 42 y.o. MRN: [id number]  CC: No chief complaint on file.   HPI AOWYN OBAID presents for a well exam C/o periods w/clots - heavy x 2 d and 8 days spotty (Dr Billy Coast)  Outpatient Medications Prior to Visit  Medication Sig Dispense Refill  . Cholecalciferol (VITAMIN D3) 2000 units TABS Take 1 tablet by mouth daily.    . Dapsone (ACZONE) 5 % topical gel Apply topically daily.    . metoprolol succinate (TOPROL-XL) 25 MG 24 hr tablet Take 1 tablet (25 mg total) by mouth daily. Must keep schedule appt for future refills 90 tablet 3  . pantoprazole (PROTONIX) 40 MG tablet Take 1 tablet (40 mg total) by mouth daily before breakfast. 90 tablet 3  . AMBULATORY NON FORMULARY MEDICATION Medication Name: budesonide suspension 2mg /10 ml 1 mg po bid Do not eat/ drink for 30 mins after taking (Patient not taking: Reported on 10/29/2017) 30 mL 0   No facility-administered medications prior to visit.     ROS: Review of Systems  Constitutional: Negative for activity change, appetite change, chills, fatigue and unexpected weight change.  HENT: Negative for congestion, mouth sores and sinus pressure.   Eyes: Negative for visual disturbance.  Respiratory: Negative for cough and chest tightness.   Gastrointestinal: Negative for abdominal pain and nausea.  Genitourinary: Positive for menstrual problem and pelvic pain. Negative for difficulty urinating, frequency and vaginal pain.  Musculoskeletal: Negative for back pain and gait problem.  Skin: Negative for pallor and rash.  Neurological: Negative for dizziness, tremors, weakness, numbness and headaches.  Hematological: Negative for adenopathy. Does not bruise/bleed easily.  Psychiatric/Behavioral: Negative for confusion, sleep disturbance and suicidal ideas.    Objective:  BP 106/76 (BP Location: Left Arm, Patient Position: Sitting, Cuff Size: Normal)   Pulse 80   Temp 98.5  F (36.9 C) (Oral)   Ht 5' 3.5" (1.613 m)   Wt 170 lb (77.1 kg)   SpO2 99%   BMI 29.64 kg/m   BP Readings from Last 3 Encounters:  06/14/18 106/76  10/29/17 100/70  10/13/17 110/70    Wt Readings from Last 3 Encounters:  06/14/18 170 lb (77.1 kg)  10/29/17 169 lb (76.7 kg)  10/13/17 170 lb (77.1 kg)    Physical Exam  Constitutional: She appears well-developed. No distress.  HENT:  Head: Normocephalic.  Right Ear: External ear normal.  Left Ear: External ear normal.  Nose: Nose normal.  Mouth/Throat: Oropharynx is clear and moist.  Eyes: Pupils are equal, round, and reactive to light. Conjunctivae are normal. Right eye exhibits no discharge. Left eye exhibits no discharge.  Neck: Normal range of motion. Neck supple. No JVD present. No tracheal deviation present. No thyromegaly present.  Cardiovascular: Normal rate, regular rhythm and normal heart sounds.  Pulmonary/Chest: No stridor. No respiratory distress. She has no wheezes.  Abdominal: Soft. Bowel sounds are normal. She exhibits no distension and no mass. There is no tenderness. There is no rebound and no guarding.  Musculoskeletal: She exhibits no edema or tenderness.  Lymphadenopathy:    She has no cervical adenopathy.  Neurological: She displays normal reflexes. No cranial nerve deficit. She exhibits normal muscle tone. Coordination normal.  Skin: No rash noted. No erythema.  Psychiatric: She has a normal mood and affect. Her behavior is normal. Judgment and thought content normal.    Lab Results  Component Value Date   WBC 6.5 06/11/2017  HGB 14.0 06/11/2017   HCT 42.0 06/11/2017   PLT 277.0 06/11/2017   GLUCOSE 95 06/11/2017   CHOL 180 06/11/2017   TRIG 98.0 06/11/2017   HDL 44.60 06/11/2017   LDLCALC 116 (H) 06/11/2017   ALT 11 06/11/2017   AST 15 06/11/2017   NA 138 06/11/2017   K 3.9 06/11/2017   CL 101 06/11/2017   CREATININE 0.78 06/11/2017   BUN 13 06/11/2017   CO2 28 06/11/2017   TSH 2.74  06/11/2017    No results found.  Assessment & Plan:   There are no diagnoses linked to this encounter.   No orders of the defined types were placed in this encounter.    Follow-up: No follow-ups on file.  Sonda Primes, MD

## 2018-06-15 LAB — IRON,TIBC AND FERRITIN PANEL
%SAT: 17 % (calc) (ref 16–45)
Ferritin: 4 ng/mL — ABNORMAL LOW (ref 16–232)
Iron: 76 ug/dL (ref 40–190)
TIBC: 459 mcg/dL (calc) — ABNORMAL HIGH (ref 250–450)

## 2018-06-24 ENCOUNTER — Encounter: Payer: Self-pay | Admitting: Oncology

## 2018-06-24 ENCOUNTER — Telehealth: Payer: Self-pay | Admitting: Oncology

## 2018-06-24 NOTE — Telephone Encounter (Signed)
New referral received from Dr. Posey ReaPlotnikov for anti-cardiolipin antibody syndrome. I cld the pt to schedule an appt to schedule an appt to see Dr. Darnelle CatalanMagrinat on 12/20 at 2pm and labs at 130pm. I was unabe to rache th pt. Lft the appt date and time on the pt's vm. Will mail a letter.

## 2018-06-29 NOTE — Progress Notes (Signed)
[facility]  [phone] [phone]     ID: Pamela Thompson DOB: 07-Apr-1976  MR#: [id number]  JYN#:[id number]  Patient Care Team: Pamela Garter, MD as PCP - General (Internal Medicine) Pamela Mackie, MD as Consulting Physician (Obstetrics and Gynecology) Magrinat, Pamela Hue, MD as Consulting Physician (Oncology) Pamela Boop, MD as Consulting Physician (Gastroenterology) OTHER MD:   CHIEF COMPLAINT: Anti-cardiolipin antibody syndrome   CURRENT TREATMENT: Observation   HISTORY OF CURRENT ILLNESS: Pamela Thompson was referred by Dr. Sonda Thompson for evaluation and treatment of anti-cardiolipin antibody syndrome.  I saw Pamela Thompson about 14 years ago during her first pregnancy when she was found to have a lupus anti-coagulant.  We discussed anticoagulation but she did not take Asprin or Lovenox during that pregnancy. Instead she and her GYN decided to deliver her baby two weeks early.  There was no postoperative clot or other complication and her daughter Pamela Thompson is currently 9-1/43 years old and very healthy.  Pamela Thompson had a second pregnancy while living in [place]. This was complicated by an early intrauterine hematoma (at 8 weeks). She was diagnosed with methylenetetrahydrofolate reductase deficiency (we do not ave that data). She was treated with high doses of folic acid. Her second child was also delivered two weeks early.  There were no other pregnancy complications.  Pamela Thompson, currently 42 years old, did develop or was found to have a right carotid "kink" at age 2 and since then has been noted to be small for age, with an extra right foot bone, and with some dental anomalies including a fused tooth.  More recently Pamela Thompson has returned to this area and established herself with Dr. Posey Thompson.  She has been complaining of heavy clotting with her periods which however are regular, lasting approximately 7 to 10 days, of which the first 2 days are heavy.   She is being referred for further evaluation of her prior history of possible lupus anticoagulant syndrome  The patient's subsequent history is as detailed below.   INTERVAL HISTORY: Pamela Thompson was evaluated in the hematology clinic on 07/02/2018    REVIEW OF SYSTEMS: Pamela Thompson is doing well overall. Her menstrual cycle has been unusually heavy for her which lead to her referral.  She has been reassured by her gynecologist that this is not so unusual at this stage in her life.  Pamela Thompson denies any history of clotting or bleeding except as noted above.  The patient denies unusual headaches, visual changes, nausea, vomiting, or dizziness. There has been no unusual cough, phlegm production, or pleurisy. This been no change in bowel or bladder habits. The patient denies unexplained fatigue or unexplained weight loss, rash, or fever. A detailed review of systems was otherwise noncontributory.    PAST MEDICAL HISTORY: Past Medical History:  Diagnosis Date  . Allergy   . Clotting disorder (HCC)    2n pregancy intrauterinal blood clot  . Eosinophilic esophagitis 08/05/2017  . GERD (gastroesophageal reflux disease)   . Hx of blood clots    uterine  . Maternal APL (antiphospholipid antibody syndrome) (HCC)   . POTS (postural orthostatic tachycardia syndrome)     PAST SURGICAL HISTORY: Past Surgical History:  Procedure Laterality Date  . COLONOSCOPY  2019   Multiple due to family history of colon cancer so far negative  . ESOPHAGOGASTRODUODENOSCOPY     2013 - [place]  . LASIK Bilateral   . MOHS SURGERY     forhead  . UPPER GASTROINTESTINAL ENDOSCOPY  Multiple, eosinophilic esophagitis diagnosed 2019    FAMILY HISTORY Family History  Problem Relation Age of Onset  . Colon cancer Mother 6  . Rectal cancer Mother   . Colon cancer Maternal Grandmother 69       died from mets close to time of dx  . Liver disease Maternal Grandmother   . Hypertension Father   . Prostate cancer Paternal  Grandfather   . Esophageal cancer Neg Hx   . Stomach cancer Neg Hx     GYNECOLOGIC HISTORY:  No LMP recorded. 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 Her husband, Pamela Thompson, is a Surveyor, quantity. Together, they have two children, Pamela Thompson (14 y/o) and Pamela Thompson (10), who are both in school and live at home. Pamela Thompson attends [employer]   ADVANCED DIRECTIVES:    HEALTH MAINTENANCE: Social History   Tobacco Use  . Smoking status: Never Smoker  . Smokeless tobacco: Never Used  Substance Use Topics  . Alcohol use: Yes    Comment: 2 per week  . Drug use: No    Colonoscopy: 07/31/2017  PAP: up to date   Bone density: no   Allergies  Allergen Reactions  . Minocycline Hives    Can take a Zpac  . Penicillins Other (See Comments)    Pt does not know reaction    Current Outpatient Medications  Medication Sig Dispense Refill  . Cholecalciferol (VITAMIN D3) 2000 units TABS Take 1 tablet by mouth daily.    . Dapsone (ACZONE) 5 % topical gel Apply topically daily.    . metoprolol succinate (TOPROL-XL) 25 MG 24 hr tablet Take 1 tablet (25 mg total) by mouth daily. Must keep schedule appt for future refills 90 tablet 3  . pantoprazole (PROTONIX) 40 MG tablet Take 1 tablet (40 mg total) by mouth daily before breakfast. 90 tablet 3  . aspirin (BAYER ASPIRIN) 325 MG tablet Take 1 tablet for prolonged trips (Patient not taking: Reported on 07/02/2018) 100 tablet 3   No current facility-administered medications for this visit.     OBJECTIVE: Middle-aged white woman in no acute distress  Vitals:   07/02/18 1412  BP: 126/75  Pulse: 82  Resp: 17  Temp: 98.9 F (37.2 C)  SpO2: 100%     Body mass index is 29.57 kg/m.   Wt Readings  from Last 3 Encounters:  07/02/18 169 lb 9.6 oz (76.9 kg)  06/14/18 170 lb (77.1 kg)  10/29/17 169 lb (76.7 kg)      ECOG FS:0 - Asymptomatic  Ocular: Sclerae unicteric, pupils round and equal Ear-nose-throat: Oropharynx clear and moist Lymphatic: No cervical or supraclavicular adenopathy Lungs no rales or rhonchi Heart regular rate and rhythm Abd soft, nontender, positive bowel sounds MSK no focal spinal tenderness, no joint edema Neuro: non-focal, well-oriented, appropriate affect Breasts: Deferred  LAB RESULTS:  CMP     Component Value Date/Time   NA 138 06/14/2018 1403   NA 143 05/09/2016   K 4.0 06/14/2018 1403   CL 102 06/14/2018 1403   CO2 26 06/14/2018 1403   GLUCOSE 95 06/14/2018 1403   BUN 12 06/14/2018 1403   BUN 14 05/09/2016   CREATININE 0.89 06/14/2018 1403   CALCIUM 9.9 06/14/2018  1403   PROT 8.0 06/14/2018 1403   ALBUMIN 4.8 06/14/2018 1403   AST 19 06/14/2018 1403   ALT 14 06/14/2018 1403   ALKPHOS 48 06/14/2018 1403   BILITOT 0.8 06/14/2018 1403    No results found for: TOTALPROTELP, ALBUMINELP, A1GS, A2GS, BETS, BETA2SER, GAMS, MSPIKE, SPEI  No results found for: Ron ParkerKPAFRELGTCHN, LAMBDASER, [facility]  Lab Results  Component Value Date   WBC 5.7 06/14/2018   NEUTROABS 3.2 06/14/2018   HGB 12.9 06/14/2018   HCT 38.1 06/14/2018   MCV 88.1 06/14/2018   PLT 244 07/02/2018    @LASTCHEMISTRY @  No results found for: LABCA2  No components found for: [id number]  Recent Labs  Lab 07/02/18 1452  INR 0.88    No results found for: LABCA2  No results found for: [id number]  No results found for: [id number]  No results found for: [id number]  No results found for: CA2729  No components found for: HGQUANT  No results found for: CEA1 / No results found for: CEA1   No results found for: AFPTUMOR  No results found for: CHROMOGRNA  No results found for: PSA1  Appointment on 07/02/2018  Component Date Value Ref Range Status  . PTT Lupus  Anticoagulant 07/02/2018 36.4  0.0 - 51.9 sec Final  . DRVVT 07/02/2018 34.9  0.0 - 47.0 sec Final  . Lupus Anticoag Interp 07/02/2018 Comment:   Corrected   Comment: (NOTE) No lupus anticoagulant was detected. Performed At: [facility] [address] [place], [place] [id number] Jolene SchimkeNagendra Sanjai MD GE:[id number]:[phone]   . Prothrombin Time 07/02/2018 11.9  11.4 - 15.2 seconds Final  . INR 07/02/2018 0.88   Final  . aPTT 07/02/2018 32  24 - 36 seconds Final  . Fibrinogen 07/02/2018 365  210 - 475 mg/dL Final  . D-Dimer, Quant 07/02/2018 0.45  0.00 - 0.50 ug/mL-FEU Final   Comment: (NOTE) At the manufacturer cut-off of 0.50 ug/mL FEU, this assay has been documented to exclude PE with a sensitivity and negative predictive value of 97 to 99%.  At this time, this assay has not been approved by the FDA to exclude DVT/VTE. Results should be correlated with clinical presentation.   . Platelets 07/02/2018 244  150 - 400 K/uL Final  . Smear Review 07/02/2018 NO SCHISTOCYTES SEEN   Final   Performed at [facility], [address] [place], [place] [id number]    (this displays the last labs from the last 3 days)  No results found for: TOTALPROTELP, ALBUMINELP, A1GS, A2GS, BETS, BETA2SER, GAMS, MSPIKE, SPEI (this displays SPEP labs)  No results found for: KPAFRELGTCHN, LAMBDASER, KAPLAMBRATIO (kappa/lambda light chains)  No results found for: HGBA, HGBA2QUANT, HGBFQUANT, HGBSQUAN (Hemoglobinopathy evaluation)   No results found for: LDH  Lab Results  Component Value Date   IRON 76 06/14/2018   TIBC 459 (H) 06/14/2018   IRONPCTSAT 17 06/14/2018   (Iron and TIBC)  Lab Results  Component Value Date   FERRITIN 4 (L) 06/14/2018    Urinalysis    Component Value Date/Time   COLORURINE YELLOW 06/14/2018 1403   APPEARANCEUR CLEAR 06/14/2018 1403   LABSPEC <=1.005 (A) 06/14/2018 1403   PHURINE 7.0 06/14/2018 1403   GLUCOSEU NEGATIVE 06/14/2018 1403   HGBUR LARGE  (A) 06/14/2018 1403   BILIRUBINUR NEGATIVE 06/14/2018 1403   KETONESUR NEGATIVE 06/14/2018 1403   UROBILINOGEN 0.2 06/14/2018 1403   NITRITE NEGATIVE 06/14/2018 1403   LEUKOCYTESUR NEGATIVE 06/14/2018 1403     STUDIES: No results found.  ELIGIBLE  FOR AVAILABLE RESEARCH PROTOCOL: no  ASSESSMENT: 42 y.o. [place]ice, [place] woman with a history of a lupus anticoagulant detected 2005, but no history of DVT or PE or any history of clotting problems despite at 2 vaginal deliveries and other minor surgeries  (1) currently lupus anticoagulant screen is negative.  Beta-2 glycoprotein and anticardiolipin antibodies are pending.  (2) history of MTHFR deficiency (data not available for review)  (3) iron deficiency without overt anemia   PLAN: I spent approximately 60 minutes face to face with Pamela Thompson with more than 50% of that time spent in counseling and coordination of care. Specifically we reviewed the biology of her diagnosis and the specifics of her situation.  She understands that the lupus anticoagulant is in most instances not associated with lupus, and that it is a functional procoagulant.  The diagnosis of the antiphospholipid syndrome requires not only laboratory detection of the lupus anticoagulant but also a history of miscarriages or clotting issues which are not present here.  In any case her lupus anticoagulant appears to have been transient and it is not demonstrable at this point.  Clearly she does not meet criteria for the lupus anticoagulant syndrome.  This requires no further evaluation.  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  The heterozygous presence of a mutation in this gene is common and may lead to elevations in homocystine, with controversial sequelae.  We did not obtain lab work regarding this and it may be prudent to obtain a homocystine and folate level through her primary care physician and replace  folate as appropriate  She is certainly iron deficient at present although not yet anemic.  She will benefit from iron replacement, which is most easily done orally  Finally Pamela Thompson expressed concerns regarding her younger daughter.  I have contacted the genetics department at [facility] to see if genetics testing is appropriate.  I have not made a return appointment for Pamela Thompson here but will be glad to see her again at any point in the future if and when the need arises.   Magrinat, Pamela Hue, MD  07/03/18 1:35 PM Medical Oncology and Hematology [facility] [address] [place], [place] [address] Tel. [phone]    Fax. [phone]    I, Mal Misty am acting as a Neurosurgeon for Lowella Dell, MD.   I, Ruthann Cancer MD, have reviewed the above documentation for accuracy and completeness, and I agree with the above.

## 2018-07-02 ENCOUNTER — Inpatient Hospital Stay: Payer: Managed Care, Other (non HMO) | Attending: Oncology | Admitting: Oncology

## 2018-07-02 ENCOUNTER — Encounter: Payer: Self-pay | Admitting: Oncology

## 2018-07-02 ENCOUNTER — Inpatient Hospital Stay: Payer: Managed Care, Other (non HMO)

## 2018-07-02 VITALS — BP 126/75 | HR 82 | Temp 98.9°F | Resp 17 | Ht 63.5 in | Wt 169.6 lb

## 2018-07-02 DIAGNOSIS — E7212 Methylenetetrahydrofolate reductase deficiency: Secondary | ICD-10-CM | POA: Diagnosis not present

## 2018-07-02 DIAGNOSIS — D6861 Antiphospholipid syndrome: Secondary | ICD-10-CM | POA: Insufficient documentation

## 2018-07-02 DIAGNOSIS — I951 Orthostatic hypotension: Secondary | ICD-10-CM | POA: Diagnosis not present

## 2018-07-02 DIAGNOSIS — E7211 Homocystinuria: Secondary | ICD-10-CM | POA: Diagnosis not present

## 2018-07-02 DIAGNOSIS — N920 Excessive and frequent menstruation with regular cycle: Secondary | ICD-10-CM | POA: Insufficient documentation

## 2018-07-02 DIAGNOSIS — R000 Tachycardia, unspecified: Secondary | ICD-10-CM | POA: Diagnosis not present

## 2018-07-02 LAB — DIC (DISSEMINATED INTRAVASCULAR COAGULATION)PANEL
D-Dimer, Quant: 0.45 ug/mL-FEU (ref 0.00–0.50)
Fibrinogen: 365 mg/dL (ref 210–475)
INR: 0.88
Platelets: 244 10*3/uL (ref 150–400)
Prothrombin Time: 11.9 seconds (ref 11.4–15.2)
aPTT: 32 seconds (ref 24–36)

## 2018-07-02 LAB — DIC (DISSEMINATED INTRAVASCULAR COAGULATION) PANEL: SMEAR REVIEW: NONE SEEN

## 2018-07-03 ENCOUNTER — Encounter: Payer: Self-pay | Admitting: Oncology

## 2018-07-03 LAB — LUPUS ANTICOAGULANT PANEL
DRVVT: 34.9 s (ref 0.0–47.0)
PTT LA: 36.4 s (ref 0.0–51.9)

## 2018-07-03 LAB — CARDIOLIPIN ANTIBODIES, IGG, IGM, IGA
Anticardiolipin IgA: <9 APL U/mL (ref 0–11)
Anticardiolipin IgG: <9 GPL U/mL (ref 0–14)
Anticardiolipin IgM: 12 MPL U/mL (ref 0–12)

## 2018-07-05 ENCOUNTER — Encounter: Payer: Self-pay | Admitting: Oncology

## 2018-07-06 LAB — BETA-2-GLYCOPROTEIN I ABS, IGG/M/A
Beta-2 Glyco I IgG: <9 GPI IgG units (ref 0–20)
Beta-2-Glycoprotein I IgA: <9 GPI IgA units (ref 0–25)

## 2018-07-13 ENCOUNTER — Other Ambulatory Visit: Payer: Self-pay | Admitting: Oncology

## 2018-08-01 NOTE — Assessment & Plan Note (Signed)
We discussed age appropriate health related issues, including available/recomended screening tests and vaccinations. We discussed a need for adhering to healthy diet and exercise. Labs were ordered to be later reviewed . All questions were answered.   

## 2018-10-06 ENCOUNTER — Encounter (HOSPITAL_COMMUNITY): Payer: Self-pay | Admitting: Emergency Medicine

## 2018-10-06 ENCOUNTER — Other Ambulatory Visit: Payer: Self-pay

## 2018-10-06 ENCOUNTER — Emergency Department (HOSPITAL_COMMUNITY)
Admission: EM | Admit: 2018-10-06 | Discharge: 2018-10-06 | Disposition: A | Discharge status: Home / Self Care | Payer: Managed Care, Other (non HMO) | Attending: Emergency Medicine | Admitting: Emergency Medicine

## 2018-10-06 ENCOUNTER — Ambulatory Visit: Payer: Self-pay | Admitting: *Deleted

## 2018-10-06 ENCOUNTER — Emergency Department (HOSPITAL_COMMUNITY): Payer: Managed Care, Other (non HMO)

## 2018-10-06 ENCOUNTER — Ambulatory Visit: Payer: Self-pay | Admitting: Internal Medicine

## 2018-10-06 DIAGNOSIS — R05 Cough: Secondary | ICD-10-CM | POA: Diagnosis not present

## 2018-10-06 DIAGNOSIS — R0789 Other chest pain: Secondary | ICD-10-CM | POA: Diagnosis not present

## 2018-10-06 DIAGNOSIS — Z20828 Contact with and (suspected) exposure to other viral communicable diseases: Secondary | ICD-10-CM | POA: Diagnosis not present

## 2018-10-06 DIAGNOSIS — R6889 Other general symptoms and signs: Secondary | ICD-10-CM

## 2018-10-06 DIAGNOSIS — Z20822 Contact with and (suspected) exposure to covid-19: Secondary | ICD-10-CM

## 2018-10-06 DIAGNOSIS — Z79899 Other long term (current) drug therapy: Secondary | ICD-10-CM | POA: Diagnosis not present

## 2018-10-06 DIAGNOSIS — R509 Fever, unspecified: Secondary | ICD-10-CM | POA: Diagnosis present

## 2018-10-06 LAB — BASIC METABOLIC PANEL
ANION GAP: 10 (ref 5–15)
BUN: 5 mg/dL — ABNORMAL LOW (ref 6–20)
CHLORIDE: 104 mmol/L (ref 98–111)
CO2: 23 mmol/L (ref 22–32)
Calcium: 9.5 mg/dL (ref 8.9–10.3)
Creatinine, Ser: 0.76 mg/dL (ref 0.44–1.00)
GFR calc Af Amer: >60 mL/min (ref >60)
GFR calc non Af Amer: >60 mL/min (ref >60)
Glucose, Bld: 147 mg/dL — ABNORMAL HIGH (ref 70–99)
POTASSIUM: 3.2 mmol/L — AB (ref 3.5–5.1)
Sodium: 137 mmol/L (ref 135–145)

## 2018-10-06 LAB — CBC WITH DIFFERENTIAL/PLATELET
ABS IMMATURE GRANULOCYTES: 0.01 10*3/uL (ref 0.00–0.07)
Basophils Absolute: 0 10*3/uL (ref 0.0–0.1)
Basophils Relative: 0 %
Eosinophils Absolute: 0.1 10*3/uL (ref 0.0–0.5)
Eosinophils Relative: 1 %
HCT: 36.4 % (ref 36.0–46.0)
Hemoglobin: 11.4 g/dL — ABNORMAL LOW (ref 12.0–15.0)
Immature Granulocytes: 0 %
LYMPHS PCT: 25 %
Lymphs Abs: 2 10*3/uL (ref 0.7–4.0)
MCH: 27.9 pg (ref 26.0–34.0)
MCHC: 31.3 g/dL (ref 30.0–36.0)
MCV: 89 fL (ref 80.0–100.0)
Monocytes Absolute: 0.6 10*3/uL (ref 0.1–1.0)
Monocytes Relative: 8 %
Neutro Abs: 5.2 10*3/uL (ref 1.7–7.7)
Neutrophils Relative %: 66 %
Platelets: 297 10*3/uL (ref 150–400)
RBC: 4.09 MIL/uL (ref 3.87–5.11)
RDW: 13.7 % (ref 11.5–15.5)
WBC: 8 10*3/uL (ref 4.0–10.5)
nRBC: 0 % (ref 0.0–0.2)

## 2018-10-06 LAB — INFLUENZA PANEL BY PCR (TYPE A & B)
INFLBPCR: NEGATIVE
Influenza A By PCR: NEGATIVE

## 2018-10-06 LAB — I-STAT TROPONIN, ED: Troponin i, poc: 0 ng/mL (ref 0.00–0.08)

## 2018-10-06 LAB — I-STAT BETA HCG BLOOD, ED (MC, WL, AP ONLY): I-stat hCG, quantitative: <5 m[IU]/mL (ref <5)

## 2018-10-06 MED ORDER — ONDANSETRON HCL 4 MG/2ML IJ SOLN
4.0000 mg | Freq: Once | INTRAMUSCULAR | Status: AC
  Administered 2018-10-06: 4 mg via INTRAVENOUS
  Filled 2018-10-06: qty 2

## 2018-10-06 MED ORDER — LIDOCAINE VISCOUS HCL 2 % MT SOLN
15.0000 mL | Freq: Once | OROMUCOSAL | Status: DC
  Filled 2018-10-06: qty 15

## 2018-10-06 MED ORDER — ALUM & MAG HYDROXIDE-SIMETH 200-200-20 MG/5ML PO SUSP
30.0000 mL | Freq: Once | ORAL | Status: DC
  Filled 2018-10-06: qty 30

## 2018-10-06 MED ORDER — SODIUM CHLORIDE 0.9 % IV BOLUS
1000.0000 mL | Freq: Once | INTRAVENOUS | Status: AC
  Administered 2018-10-06: 1000 mL via INTRAVENOUS

## 2018-10-06 MED ORDER — POTASSIUM CHLORIDE CRYS ER 20 MEQ PO TBCR
40.0000 meq | EXTENDED_RELEASE_TABLET | Freq: Once | ORAL | Status: AC
  Administered 2018-10-06: 40 meq via ORAL
  Filled 2018-10-06: qty 2

## 2018-10-06 MED ORDER — FAMOTIDINE 20 MG IN NS 100 ML IVPB
20.0000 mg | Freq: Once | INTRAVENOUS | Status: AC
  Administered 2018-10-06: 20 mg via INTRAVENOUS
  Filled 2018-10-06: qty 100

## 2018-10-06 NOTE — Telephone Encounter (Signed)
See other TE.

## 2018-10-06 NOTE — ED Provider Notes (Signed)
ECG is sinus tachycardia rate of 106 otherwise normal intervals. sinus rhythm with nonspecific T wave abnormality anteriorly.  No prior EKG to compare with.   Terrilee Files, MD 10/06/18 2010

## 2018-10-06 NOTE — Telephone Encounter (Signed)
Noted, thank you

## 2018-10-06 NOTE — Telephone Encounter (Signed)
I called this morning and I have not heard from Dr. 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   (See prior note where she called and was triaged this morning).  I sent this note to the office.

## 2018-10-06 NOTE — Telephone Encounter (Signed)
Patient had fever last night- she feels like she broke it last night- she feels like it maybe going back up. Her father has been sick in [place] she has been traveling back and forth. She is driving back now- she will be arriving around 4:00 today.  Patient reports she had pulse rate of 130 yesterday- it has come back today. Now it is 108. 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 Patient has recent travel to [place] were she had to hospitalize her father and she was with her mother who will be caring for him when he returns home- she needs to know exposure risk for mother. Patient is returning tonight around 4pm and would like some guidance as to possible testing if available to her. COVID-19 precautions reviewed and latest advisements given. Patient advised PCP would be forwarded concerns and call information to see if E-visit is possible.   Reason for Disposition . [1] Fever AND [2] no signs of serious infection or localizing symptoms (all other triage questions negative) . [1] Fever or feeling feverish AND [2] within 14 Days of COVID-19 EXPOSURE (Close Contact)    Patient is calling with concerns that she may have exposure to COVID-19. Precautions given- call sent to office for review  Answer Assessment - Initial Assessment Questions 1. TEMPERATURE: "What is the most recent temperature?"  "How was it measured?"      98.7 digital oral this morning 2. ONSET: "When did the fever start?"      Yesterday- sudden wave of nausea 3. SYMPTOMS: "Do you have any other symptoms besides the fever?"  (e.g., colds, headache, sore throat, earache, cough, rash, diarrhea, vomiting, abdominal pain)     Headache 2 days, cough since March 1st- dry cough- not bad, nausea 4. CAUSE: If there are no symptoms, ask: "What do you think is causing the fever?"      unknown 5. CONTACTS: "Does anyone else in the family have an  infection?"     no 6. TREATMENT: "What have you done so far to treat this fever?" (e.g., medications)     Tylenol, motrin 7. IMMUNOCOMPROMISE: "Do you have of the following: diabetes, HIV positive, splenectomy, cancer chemotherapy, chronic steroid treatment, transplant patient, etc."     POTS syndrome 8. PREGNANCY: "Is there any chance you are pregnant?" "When was your last menstrual period?"     No, LMP- now 9. TRAVEL: "Have you traveled out of the country in the last month?" (e.g., travel history, exposures)     No travel, flew to [place] early in the month  Protocols used: FEVER-A-AH, CORONAVIRUS (COVID-19) EXPOSURE-A-AH

## 2018-10-06 NOTE — Discharge Instructions (Signed)
Person Under Monitoring Name: Pamela Thompson  Location: [address] [place] [address]   Infection Prevention Recommendations for Individuals Confirmed to have, or Being Evaluated for, 2019 Novel Coronavirus (COVID-19) Infection Who Receive Care at Home  Individuals who are confirmed to have, or are being evaluated for, COVID-19 should follow the prevention steps below until a healthcare provider or local or state health department says they can return to normal activities.  Stay home except to get medical care You should restrict activities outside your home, except for getting medical care. Do not go to work, school, or public areas, and do not use public transportation or taxis.  Call ahead before visiting your doctor Before your medical appointment, call the healthcare provider and tell them that you have, or are being evaluated for, COVID-19 infection. This will help the healthcare providers office take steps to keep other people from getting infected. Ask your healthcare provider to call the local or state health department.  Monitor your symptoms Seek prompt medical attention if your illness is worsening (e.g., difficulty breathing). Before going to your medical appointment, call the healthcare provider and tell them that you have, or are being evaluated for, COVID-19 infection. Ask your healthcare provider to call the local or state health department.  Wear a facemask You should wear a facemask that covers your nose and mouth when you are in the same room with other people and when you visit a healthcare provider. People who live with or visit you should also wear a facemask while they are in the same room with you.  Separate yourself from other people in your home As much as possible, you should stay in a different room from other people in your home. Also, you should use a separate bathroom, if available.  Avoid sharing household items You should not share  dishes, drinking glasses, cups, eating utensils, towels, bedding, or other items with other people in your home. After using these items, you should wash them thoroughly with soap and water.  Cover your coughs and sneezes Cover your mouth and nose with a tissue when you cough or sneeze, or you can cough or sneeze into your sleeve. Throw used tissues in a lined trash can, and immediately wash your hands with soap and water for at least 20 seconds or use an alcohol-based hand rub.  Wash your [employer] your hands often and thoroughly with soap and water for at least 20 seconds. You can use an alcohol-based hand sanitizer if soap and water are not available and if your hands are not visibly dirty. Avoid touching your eyes, nose, and mouth with unwashed hands.   Prevention Steps for Caregivers and Household Members of Individuals Confirmed to have, or Being Evaluated for, COVID-19 Infection Being Cared for in the Home  If you live with, or provide care at home for, a person confirmed to have, or being evaluated for, COVID-19 infection please follow these guidelines to prevent infection:  Follow healthcare providers instructions Make sure that you understand and can help the patient follow any healthcare provider instructions for all care.  Provide for the patients basic needs You should help the patient with basic needs in the home and provide support for getting groceries, prescriptions, and other personal needs.  Monitor the patients symptoms If they are getting sicker, call his or her medical provider and tell them that the patient has, or is being evaluated for, COVID-19 infection. This will help the healthcare providers office  take steps to keep other people from getting infected. °Ask the healthcare provider to call the local or state health department. ° °Limit the number of people who have contact with the patient °If possible, have only one caregiver for the patient. °Other  household members should stay in another home or place of residence. If this is not possible, they should stay °in another room, or be separated from the patient as much as possible. Use a separate bathroom, if available. °Restrict visitors who do not have an essential need to be in the home. ° °Keep older adults, very young children, and other sick people away from the patient °Keep older adults, very young children, and those who have compromised immune systems or chronic health conditions away from the patient. This includes people with chronic heart, lung, or kidney conditions, diabetes, and cancer. ° °Ensure good ventilation °Make sure that shared spaces in the home have good air flow, such as from an air conditioner or an opened window, °weather permitting. ° °Wash your hands often °Wash your hands often and thoroughly with soap and water for at least 20 seconds. You can use an alcohol based hand sanitizer if soap and water are not available and if your hands are not visibly dirty. °Avoid touching your eyes, nose, and mouth with unwashed hands. °Use disposable paper towels to dry your hands. If not available, use dedicated cloth towels and replace them when they become wet. ° °Wear a facemask and gloves °Wear a disposable facemask at all times in the room and gloves when you touch or have contact with the patient’s blood, body fluids, and/or secretions or excretions, such as sweat, saliva, sputum, nasal mucus, vomit, urine, or feces.  Ensure the mask fits over your nose and mouth tightly, and do not touch it during use. °Throw out disposable facemasks and gloves after using them. Do not reuse. °Wash your hands immediately after removing your facemask and gloves. °If your personal clothing becomes contaminated, carefully remove clothing and launder. Wash your hands after handling contaminated clothing. °Place all used disposable facemasks, gloves, and other waste in a lined container before disposing them with  other household waste. °Remove gloves and wash your hands immediately after handling these items. ° °Do not share dishes, glasses, or other household items with the patient °Avoid sharing household items. You should not share dishes, drinking glasses, cups, eating utensils, towels, bedding, or other items with a patient who is confirmed to have, or being evaluated for, COVID-19 infection. °After the person uses these items, you should wash them thoroughly with soap and water. ° °Wash laundry thoroughly °Immediately remove and wash clothes or bedding that have blood, body fluids, and/or secretions or excretions, such as sweat, saliva, sputum, nasal mucus, vomit, urine, or feces, on them. °Wear gloves when handling laundry from the patient. °Read and follow directions on labels of laundry or clothing items and detergent. In general, wash and dry with the warmest temperatures recommended on the label. ° °Clean all areas the individual has used often °Clean all touchable surfaces, such as counters, tabletops, doorknobs, bathroom fixtures, toilets, phones, keyboards, tablets, and bedside tables, every day. Also, clean any surfaces that may have blood, body fluids, and/or secretions or excretions on them. °Wear gloves when cleaning surfaces the patient has come in contact with. °Use a diluted bleach solution (e.g., dilute bleach with 1 part bleach and 10 parts water) or a household disinfectant with a label that says EPA-registered for coronaviruses. To make a bleach   solution at home, add 1 tablespoon of bleach to 1 quart (4 cups) of water. For a larger supply, add  cup of bleach to 1 gallon (16 cups) of water. Read labels of cleaning products and follow recommendations provided on product labels. Labels contain instructions for safe and effective use of the cleaning product including precautions you should take when applying the product, such as wearing gloves or eye protection and making sure you have good ventilation  during use of the product. Remove gloves and wash hands immediately after cleaning.  Monitor yourself for signs and symptoms of illness Caregivers and household members are considered close contacts, should monitor their health, and will be asked to limit movement outside of the home to the extent possible. Follow the monitoring steps for close contacts listed on the symptom monitoring form.   ? If you have additional questions, contact your local health department or call the epidemiologist on call at [phone] (available 24/7). ? This guidance is subject to change. For the most up-to-date guidance from [facility], please refer to their website: [email or web address]

## 2018-10-06 NOTE — ED Triage Notes (Signed)
Pt arrives with a c/o of fever chills and burning in her chest. Pt traveled to [place] to be in the hospital with her dying father 14 days ago. 2 days ago patient began to have a dry cough with fevers. Yesterday pt developed palpations and a burning in her chest. 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 Was told to come here to rule out COVID-19.

## 2018-10-06 NOTE — ED Provider Notes (Addendum)
[facility] EMERGENCY DEPARTMENT Provider Note   CSN: [id number] Arrival date & time: 10/06/18  1749    History   Chief Complaint Chief Complaint  Patient presents with  . Fever  . Chest Pain    HPI Pamela Thompson is a 43 y.o. female.     Pamela Thompson is a 43 y.o. female with a history of POTS and GERD, who presents for evaluation of fever, chills, cough and burning in her chest.  Patient was sent here to rule out COVID-19, given recent travel.  Patient recently traveled to [place] and spent time in the hospital in ICU visiting a critically ill family member.  She traveled through the airport approximately 12 days ago and developed symptoms 2 days ago.  Patient reports she initially started having low-grade fevers, chills and dry cough.  She reports associated fatigue and myalgias and that she also had some nausea but no vomiting or abdominal pain.  She denies any diarrhea.  She reports that yesterday and today she also developed some chest burning sensation and had some intermittent palpitations, no near syncope or syncopal episodes.  Reports she has had low-grade fevers measured at home but has been taking Tylenol regularly.  She reports she is also had night sweats.  Denies any shortness of breath or difficulty breathing.  No lower extremity swelling or pain.  Aside from pots which she takes metoprolol for no other history of cardiac issues.  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  No other aggravating or alleviating factors.     Past Medical History:  Diagnosis Date  . Allergy   . Clotting disorder (HCC)    2n pregancy intrauterinal blood clot  . Eosinophilic esophagitis 08/05/2017  . GERD (gastroesophageal reflux disease)   . Hx of blood clots    uterine  . Maternal APL (antiphospholipid antibody syndrome) (HCC)   . POTS (postural orthostatic tachycardia syndrome)      Patient Active Problem List   Diagnosis Date Noted  . MTHFR (methylene THF reductase) deficiency and homocystinuria (HCC) 07/02/2018  . Heavy periods 06/14/2018  . Right calf pain 10/29/2017  . Eosinophilic esophagitis 08/05/2017  . Family history of colon cancer 06/08/2017  . Upper respiratory infection 06/08/2017  . Heart palpitations 07/29/2016  . Well adult exam 05/26/2016  . POTS (postural orthostatic tachycardia syndrome) 05/26/2016  . Grief 05/26/2016  . Anti-cardiolipin antibody syndrome (HCC) 05/26/2016    Past Surgical History:  Procedure Laterality Date  . COLONOSCOPY  2019   Multiple due to family history of colon cancer so far negative  . ESOPHAGOGASTRODUODENOSCOPY     2013 - [place]  . LASIK Bilateral   . MOHS SURGERY     forhead  . UPPER GASTROINTESTINAL ENDOSCOPY     Multiple, eosinophilic esophagitis diagnosed 2019     OB History   No obstetric history on file.      Home Medications    Prior to Admission medications   Medication Sig Start Date End Date Taking? Authorizing Provider  Ascorbic Acid (VITAMIN C PO) Take 1 tablet by mouth daily.   Yes [provider]  b complex vitamins capsule Take 1 capsule by mouth every other day.   Yes [provider]  Cholecalciferol (VITAMIN D3) 2000 units TABS Take 1 tablet by mouth every other day.    Yes [provider]  metoprolol succinate (TOPROL-XL) 25 MG 24  hr tablet Take 1 tablet (25 mg total) by mouth daily. Must keep schedule appt for future refills 06/14/18  Yes Plotnikov, Georgina Quint, MD  pantoprazole (PROTONIX) 40 MG tablet Take 1 tablet (40 mg total) by mouth daily before breakfast. 06/14/18  Yes Plotnikov, Georgina Quint, MD  aspirin (BAYER ASPIRIN) 325 MG tablet Take 1 tablet for prolonged trips Patient not taking: Reported on 07/02/2018 06/14/18   Plotnikov, Georgina Quint, MD    Family History Family History  Problem Relation Age of Onset  . Colon cancer Mother 6  . Rectal cancer  Mother   . Colon cancer Maternal Grandmother 69       died from mets close to time of dx  . Liver disease Maternal Grandmother   . Hypertension Father   . Prostate cancer Paternal Grandfather   . Esophageal cancer Neg Hx   . Stomach cancer Neg Hx     Social History Social History   Tobacco Use  . Smoking status: Never Smoker  . Smokeless tobacco: Never Used  Substance Use Topics  . Alcohol use: Yes    Comment: 2 per week  . Drug use: No     Allergies   Minocycline and Penicillins   Review of Systems Review of Systems  Constitutional: Positive for chills, fatigue and fever.  HENT: Positive for rhinorrhea. Negative for congestion, ear pain and sore throat.   Eyes: Negative for visual disturbance.  Respiratory: Positive for cough. Negative for chest tightness, shortness of breath and wheezing.   Cardiovascular: Positive for chest pain and palpitations. Negative for leg swelling.  Gastrointestinal: Positive for nausea. Negative for abdominal pain, constipation, diarrhea and vomiting.  Genitourinary: Negative for dysuria and frequency.  Musculoskeletal: Positive for myalgias. Negative for arthralgias, back pain, neck pain and neck stiffness.  Skin: Negative for color change and rash.  Neurological: Negative for dizziness, syncope, light-headedness and headaches.     Physical Exam Updated Vital Signs BP (!) 144/94 (BP Location: Right Arm)   Pulse (!) 109   Temp 99 F (37.2 C) (Oral)   Resp 16   SpO2 100%   Physical Exam Vitals signs and nursing note reviewed.  Constitutional:      General: She is not in acute distress.    Appearance: She is well-developed and normal weight. She is not ill-appearing or diaphoretic.  HENT:     Head: Normocephalic and atraumatic.  Eyes:     General:        Right eye: No discharge.        Left eye: No discharge.     Extraocular Movements: Extraocular movements intact.     Pupils: Pupils are equal, round, and reactive to light.   Neck:     Musculoskeletal: Neck supple.     Trachea: No tracheal deviation.  Cardiovascular:     Rate and Rhythm: Normal rate and regular rhythm.     Pulses:          Radial pulses are 2+ on the right side and 2+ on the left side.       Dorsalis pedis pulses are 2+ on the right side and 2+ on the left side.     Heart sounds: Normal heart sounds. No murmur. No friction rub. No gallop.   Pulmonary:     Effort: Pulmonary effort is normal. No respiratory distress.     Breath sounds: Normal breath sounds. No wheezing or rales.     Comments: Respirations equal and unlabored, patient able to speak  in full sentences, lungs clear to auscultation bilaterally Chest:     Chest wall: No tenderness.     Comments: Chest wall NTTP, no rash or skin changes Abdominal:     General: Bowel sounds are normal. There is no distension.     Palpations: Abdomen is soft. There is no mass.     Tenderness: There is no abdominal tenderness. There is no guarding.     Comments: Abdomen soft, nondistended, nontender to palpation in all quadrants without guarding or peritoneal signs  Musculoskeletal:        General: No deformity.     Right lower leg: She exhibits no tenderness. No edema.     Left lower leg: No edema.  Skin:    General: Skin is warm and dry.     Capillary Refill: Capillary refill takes less than 2 seconds.  Neurological:     Mental Status: She is alert and oriented to person, place, and time.     Coordination: Coordination normal.     Comments: Speech is clear, able to follow commands Moves extremities without ataxia, coordination intact  Psychiatric:        Mood and Affect: Mood normal.        Behavior: Behavior normal.      ED Treatments / Results  Labs (all labs ordered are listed, but only abnormal results are displayed) Labs Reviewed  BASIC METABOLIC PANEL - Abnormal; Notable for the following components:      Result Value   Potassium 3.2 (*)    Glucose, Bld 147 (*)    BUN 5 (*)     All other components within normal limits  CBC WITH DIFFERENTIAL/PLATELET - Abnormal; Notable for the following components:   Hemoglobin 11.4 (*)    All other components within normal limits  NOVEL CORONAVIRUS, NAA (HOSPITAL ORDER, SEND-OUT TO REF LAB)  INFLUENZA PANEL BY PCR (TYPE A & B)  I-STAT TROPONIN, ED  I-STAT BETA HCG BLOOD, ED ([facility], [facility], [facility] ONLY)    EKG None  Radiology Dg Chest Port 1 View  Result Date: 10/06/2018 CLINICAL DATA:  Cough with chest burning. EXAM: PORTABLE CHEST 1 VIEW COMPARISON:  None. FINDINGS: 1828 hours. Two views obtained. The heart size and mediastinal contours are normal. The lungs are clear. There is no pleural effusion or pneumothorax. No acute osseous findings are identified. IMPRESSION: No active cardiopulmonary process. Electronically Signed   By: Carey Bullocks M.D.   On: 10/06/2018 19:10    Procedures Procedures (including critical care time)  Medications Ordered in ED Medications  alum & mag hydroxide-simeth (MAALOX/MYLANTA) 200-200-20 MG/5ML suspension 30 mL (30 mLs Oral Refused 10/06/18 2028)    And  lidocaine (XYLOCAINE) 2 % viscous mouth solution 15 mL (15 mLs Oral Refused 10/06/18 2029)  sodium chloride 0.9 % bolus 1,000 mL (0 mLs Intravenous Stopped 10/06/18 2058)  ondansetron (ZOFRAN) injection 4 mg (4 mg Intravenous Given 10/06/18 1947)  famotidine (PEPCID) IVPB 20 mg in NS 100 mL IVPB (0 mg Intravenous Stopped 10/06/18 2058)  potassium chloride SA (K-DUR,KLOR-CON) CR tablet 40 mEq (40 mEq Oral Given 10/06/18 1946)     Initial Impression / Assessment and Plan / ED Course  I have reviewed the triage vital signs and the nursing notes.  Pertinent labs & imaging results that were available during my care of the patient were reviewed by me and considered in my medical decision making (see chart for details).  Patient presents for evaluation of fevers chills, cough and chest burning  sensation.  Patient had recent travel to [place] through  airport and was also in [place] hospitals visiting an ill family member and I am concerned she is at risk for possible COVID-19.  Given that she is also having a chest burning sensation and does have history of pots also concern for possible cardiac etiology, although I feel ACS is unlikely, patient does also have history of GERD which may be contributing.  She has not had any shortness of breath or difficulty breathing and is satting well on room air with normal respiratory effort, lungs are clear.  Low-grade fever here and vitals otherwise normal.  Will check basic labs, troponin, EKG, portable chest x-ray, flu and coronavirus testing.  Patient placed on both droplet and contact precautions.  EKG with mild sinus tachycardia, some nonspecific T wave changes, but otherwise reassuring, negative troponin.  Chest x-ray shows no evidence of pneumonia or other active cardiopulmonary disease.  Labs show normal white count, stable hemoglobin, no acute electrolyte derangements aside from mild hypokalemia, p.o. potassium replacement provided.  Normal renal function.  Flu testing negative.  Patient was treated symptomatically with IV fluids, Tylenol, Pepcid and Zofran and she reports improvement in chest burning sensation.  On recheck vitals normal.  Coronavirus testing collected and pending.  Patient was instructed on self quarantine.  CDC PUI forms completed.  Patient will be discharged home to self quarantine, strict return precautions discussed.  Patient expresses understanding and agreement with plan.  Discharged home in good condition.   Pamela Thompson was evaluated in Emergency Department on 10/07/2018 for the symptoms described in the history of present illness. She was evaluated in the context of the global COVID-19 pandemic, which necessitated consideration that the patient might be at risk for infection with the SARS-CoV-2 virus that causes COVID-19. Institutional protocols and algorithms that pertain to the  evaluation of patients at risk for COVID-19 are in a state of rapid change based on information released by regulatory bodies including the CDC and federal and state organizations. These policies and algorithms were followed during the patient's care in the ED.   Final Clinical Impressions(s) / ED Diagnoses   Final diagnoses:  Suspected Covid-19 Virus Infection  Atypical chest pain    ED Discharge Orders    None       Dartha Lodge, PA-C 10/06/18 2358    Dartha Lodge, PA-C 10/07/18 1324    Sabas Sous, MD 10/09/18 [phone]

## 2018-10-06 NOTE — Telephone Encounter (Signed)
Patient states she has had fever, night sweats, burning in chest and throat, chills and dry cough.  States it does not feel like a normal flu.  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  She also has been exposed to her father in [place] at the [facility].  States father is very ill.  States has also been in contact with fathers spouse.  She would like to know if she has COVID 19.   I informed patient that [facility] was not testing and that we were advising on quarantine.   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   States she will also try [facility] or [facility] for COVID 19 testing as this is very important for her to find out.

## 2018-10-06 NOTE — ED Notes (Signed)
Pt has call bell and telephone for any questions.

## 2018-10-08 LAB — NOVEL CORONAVIRUS, NAA (HOSPITAL ORDER, SEND-OUT TO REF LAB)

## 2018-10-08 LAB — NOVEL CORONAVIRUS, NAA (HOSP ORDER, SEND-OUT TO REF LAB; TAT 18-24 HRS): SARS-CoV-2, NAA: NOT DETECTED

## 2018-11-09 ENCOUNTER — Encounter: Payer: Self-pay | Admitting: Internal Medicine

## 2018-11-09 ENCOUNTER — Ambulatory Visit (INDEPENDENT_AMBULATORY_CARE_PROVIDER_SITE_OTHER): Payer: Managed Care, Other (non HMO) | Admitting: Internal Medicine

## 2018-11-09 DIAGNOSIS — E7212 Methylenetetrahydrofolate reductase deficiency: Secondary | ICD-10-CM

## 2018-11-09 DIAGNOSIS — R002 Palpitations: Secondary | ICD-10-CM

## 2018-11-09 DIAGNOSIS — D6861 Antiphospholipid syndrome: Secondary | ICD-10-CM

## 2018-11-09 DIAGNOSIS — E7211 Homocystinuria: Secondary | ICD-10-CM

## 2018-11-09 DIAGNOSIS — R5383 Other fatigue: Secondary | ICD-10-CM | POA: Diagnosis not present

## 2018-11-09 DIAGNOSIS — F4321 Adjustment disorder with depressed mood: Secondary | ICD-10-CM

## 2018-11-09 DIAGNOSIS — D509 Iron deficiency anemia, unspecified: Secondary | ICD-10-CM | POA: Insufficient documentation

## 2018-11-09 DIAGNOSIS — D500 Iron deficiency anemia secondary to blood loss (chronic): Secondary | ICD-10-CM

## 2018-11-09 NOTE — Assessment & Plan Note (Signed)
"   she does not meet criteria for the antiphospholipid syndrome and currently her lupus anticoagulant screen is negative"

## 2018-11-09 NOTE — Assessment & Plan Note (Addendum)
likely a postviral fatigue aggravated by grief Will obtain lab work including cortisol, vitamin B12 and others. Will obtain COVID 19 IgG, to determine whether her viral syndrome was triggered by COVID-19 Reduce metoprolol to 1/2 tablet a day to see if it was a contributing factor

## 2018-11-09 NOTE — Assessment & Plan Note (Signed)
Labs

## 2018-11-09 NOTE — Assessment & Plan Note (Signed)
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  It may be worth while to check folate and homocysteine levels in her and if abnormal proceed to folate supplementation.  Labs

## 2018-11-09 NOTE — Assessment & Plan Note (Signed)
father died of esophageal cancer -the patient is coping fairly well.  Psychology referral offered.  It could be contributing to her fatigue

## 2018-11-09 NOTE — Progress Notes (Signed)
Virtual Visit via Video Note  I connected with Pamela Thompson on 11/09/18 at  1:40 PM EDT by a video enabled telemedicine application and verified that I am speaking with the correct person using two identifiers.   I discussed the limitations of evaluation and management by telemedicine and the availability of in person appointments. The patient expressed understanding and agreed to proceed.  History of Present Illness:   Pamela Thompson has developed severe fatigue following her acute viral illness in the end of March 2020.  She was in the emergency room on 3/25 with symptoms suspicious for old COVID-19.  Her nasal smear PCR test was negative. 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 Her father died recently of esophageal cancer.  She is very upset.  It took only 6 weeks from the diagnosis to his death. Pamela Thompson is working part-time from home. Observations/Objective: She is in no acute distress.  She appears sad and tearful Denies being depressed Assessment and Plan:  See plan Follow Up Instructions:    I discussed the assessment and treatment plan with the patient. The patient was provided an opportunity to ask questions and all were answered. The patient agreed with the plan and demonstrated an understanding of the instructions.   The patient was advised to call back or seek an in-person evaluation if the symptoms worsen or if the condition fails to improve as anticipated.  I provided 25 minutes of non-face-to-face time during this encounter.   Sonda Primes, MD

## 2018-11-09 NOTE — Assessment & Plan Note (Signed)
On Toprol XL 

## 2018-11-10 NOTE — Addendum Note (Signed)
Addended by: Tresa Garter on: 11/10/2018 01:11 PM   Modules accepted: Orders

## 2018-11-12 ENCOUNTER — Other Ambulatory Visit (INDEPENDENT_AMBULATORY_CARE_PROVIDER_SITE_OTHER): Payer: Managed Care, Other (non HMO)

## 2018-11-12 DIAGNOSIS — E7212 Methylenetetrahydrofolate reductase deficiency: Secondary | ICD-10-CM

## 2018-11-12 DIAGNOSIS — R5383 Other fatigue: Secondary | ICD-10-CM | POA: Diagnosis not present

## 2018-11-12 DIAGNOSIS — E7211 Homocystinuria: Secondary | ICD-10-CM

## 2018-11-12 LAB — CBC WITH DIFFERENTIAL/PLATELET
Basophils Absolute: 0 10*3/uL (ref 0.0–0.1)
Basophils Relative: 0.4 % (ref 0.0–3.0)
Eosinophils Absolute: 0.1 10*3/uL (ref 0.0–0.7)
Eosinophils Relative: 2.5 % (ref 0.0–5.0)
HCT: 34.7 % — ABNORMAL LOW (ref 36.0–46.0)
Hemoglobin: 11.6 g/dL — ABNORMAL LOW (ref 12.0–15.0)
Lymphocytes Relative: 30.7 % (ref 12.0–46.0)
Lymphs Abs: 1.5 10*3/uL (ref 0.7–4.0)
MCHC: 33.4 g/dL (ref 30.0–36.0)
MCV: 85.6 fl (ref 78.0–100.0)
Monocytes Absolute: 0.4 10*3/uL (ref 0.1–1.0)
Monocytes Relative: 8.4 % (ref 3.0–12.0)
Neutro Abs: 2.9 10*3/uL (ref 1.4–7.7)
Neutrophils Relative %: 58 % (ref 43.0–77.0)
Platelets: 293 10*3/uL (ref 150.0–400.0)
RBC: 4.05 Mil/uL (ref 3.87–5.11)
RDW: 14.8 % (ref 11.5–15.5)
WBC: 4.9 10*3/uL (ref 4.0–10.5)

## 2018-11-12 LAB — HEPATIC FUNCTION PANEL
ALT: 11 U/L (ref 0–35)
AST: 17 U/L (ref 0–37)
Albumin: 4.3 g/dL (ref 3.5–5.2)
Alkaline Phosphatase: 44 U/L (ref 39–117)
Bilirubin, Direct: 0.1 mg/dL (ref 0.0–0.3)
Total Bilirubin: 0.5 mg/dL (ref 0.2–1.2)
Total Protein: 7.1 g/dL (ref 6.0–8.3)

## 2018-11-12 LAB — BASIC METABOLIC PANEL
BUN: 8 mg/dL (ref 6–23)
CO2: 26 mEq/L (ref 19–32)
Calcium: 9.3 mg/dL (ref 8.4–10.5)
Chloride: 103 mEq/L (ref 96–112)
Creatinine, Ser: 0.73 mg/dL (ref 0.40–1.20)
GFR: 86.51 mL/min (ref >60.00)
Glucose, Bld: 99 mg/dL (ref 70–99)
Potassium: 3.9 mEq/L (ref 3.5–5.1)
Sodium: 138 mEq/L (ref 135–145)

## 2018-11-12 LAB — URINALYSIS
Bilirubin Urine: NEGATIVE
Hgb urine dipstick: NEGATIVE
Ketones, ur: NEGATIVE
Leukocytes,Ua: NEGATIVE
Nitrite: NEGATIVE
Specific Gravity, Urine: 1.01 (ref 1.000–1.030)
Total Protein, Urine: NEGATIVE
Urine Glucose: NEGATIVE
Urobilinogen, UA: 0.2 (ref 0.0–1.0)
pH: 7 (ref 5.0–8.0)

## 2018-11-12 LAB — TSH: TSH: 1.68 u[IU]/mL (ref 0.35–4.50)

## 2018-11-12 LAB — CORTISOL: Cortisol, Plasma: 9.6 ug/dL

## 2018-11-12 LAB — VITAMIN B12: Vitamin B-12: 411 pg/mL (ref 211–911)

## 2018-11-12 LAB — FOLATE: Folate: >23.3 ng/mL (ref >5.9)

## 2018-11-15 LAB — IRON,TIBC AND FERRITIN PANEL
%SAT: 7 % (calc) — ABNORMAL LOW (ref 16–45)
Ferritin: 4 ng/mL — ABNORMAL LOW (ref 16–232)
Iron: 30 ug/dL — ABNORMAL LOW (ref 40–190)
TIBC: 409 mcg/dL (calc) (ref 250–450)

## 2018-11-15 LAB — HOMOCYSTEINE: Homocysteine: 7 umol/L (ref <10.4)

## 2018-11-15 LAB — SAR COV2 SEROLOGY (COVID19)AB(IGG),IA: SARS CoV2 AB IGG: NEGATIVE

## 2019-06-30 ENCOUNTER — Ambulatory Visit (INDEPENDENT_AMBULATORY_CARE_PROVIDER_SITE_OTHER): Payer: Managed Care, Other (non HMO) | Admitting: Internal Medicine

## 2019-06-30 ENCOUNTER — Other Ambulatory Visit: Payer: Self-pay

## 2019-06-30 ENCOUNTER — Other Ambulatory Visit (INDEPENDENT_AMBULATORY_CARE_PROVIDER_SITE_OTHER): Payer: Managed Care, Other (non HMO)

## 2019-06-30 ENCOUNTER — Encounter: Payer: Self-pay | Admitting: Internal Medicine

## 2019-06-30 VITALS — BP 122/74 | HR 66 | Temp 98.0°F | Ht 63.5 in | Wt 163.0 lb

## 2019-06-30 DIAGNOSIS — I498 Other specified cardiac arrhythmias: Secondary | ICD-10-CM | POA: Diagnosis not present

## 2019-06-30 DIAGNOSIS — R5383 Other fatigue: Secondary | ICD-10-CM | POA: Diagnosis not present

## 2019-06-30 DIAGNOSIS — D508 Other iron deficiency anemias: Secondary | ICD-10-CM

## 2019-06-30 DIAGNOSIS — Z0000 Encounter for general adult medical examination without abnormal findings: Secondary | ICD-10-CM

## 2019-06-30 DIAGNOSIS — D6861 Antiphospholipid syndrome: Secondary | ICD-10-CM

## 2019-06-30 DIAGNOSIS — G90A Postural orthostatic tachycardia syndrome (POTS): Secondary | ICD-10-CM

## 2019-06-30 LAB — TSH: TSH: 2.23 u[IU]/mL (ref 0.35–4.50)

## 2019-06-30 LAB — URINALYSIS
Bilirubin Urine: NEGATIVE
Hgb urine dipstick: NEGATIVE
Ketones, ur: NEGATIVE
Leukocytes,Ua: NEGATIVE
Nitrite: NEGATIVE
Specific Gravity, Urine: <=1.005 — AB (ref 1.000–1.030)
Total Protein, Urine: NEGATIVE
Urine Glucose: NEGATIVE
Urobilinogen, UA: 0.2 (ref 0.0–1.0)
pH: 6.5 (ref 5.0–8.0)

## 2019-06-30 LAB — BASIC METABOLIC PANEL
BUN: 12 mg/dL (ref 6–23)
CO2: 28 mEq/L (ref 19–32)
Calcium: 9.3 mg/dL (ref 8.4–10.5)
Chloride: 103 mEq/L (ref 96–112)
Creatinine, Ser: 0.79 mg/dL (ref 0.40–1.20)
GFR: 78.75 mL/min (ref >60.00)
Glucose, Bld: 98 mg/dL (ref 70–99)
Potassium: 4 mEq/L (ref 3.5–5.1)
Sodium: 137 mEq/L (ref 135–145)

## 2019-06-30 LAB — HEPATIC FUNCTION PANEL
ALT: 15 U/L (ref 0–35)
AST: 21 U/L (ref 0–37)
Albumin: 4.1 g/dL (ref 3.5–5.2)
Alkaline Phosphatase: 44 U/L (ref 39–117)
Bilirubin, Direct: 0.1 mg/dL (ref 0.0–0.3)
Total Bilirubin: 0.5 mg/dL (ref 0.2–1.2)
Total Protein: 6.9 g/dL (ref 6.0–8.3)

## 2019-06-30 LAB — CBC WITH DIFFERENTIAL/PLATELET
Basophils Absolute: 0 10*3/uL (ref 0.0–0.1)
Basophils Relative: 0.4 % (ref 0.0–3.0)
Eosinophils Absolute: 0.2 10*3/uL (ref 0.0–0.7)
Eosinophils Relative: 3.3 % (ref 0.0–5.0)
HCT: 35.1 % — ABNORMAL LOW (ref 36.0–46.0)
Hemoglobin: 11.8 g/dL — ABNORMAL LOW (ref 12.0–15.0)
Lymphocytes Relative: 30.8 % (ref 12.0–46.0)
Lymphs Abs: 1.7 10*3/uL (ref 0.7–4.0)
MCHC: 33.7 g/dL (ref 30.0–36.0)
MCV: 89.3 fl (ref 78.0–100.0)
Monocytes Absolute: 0.4 10*3/uL (ref 0.1–1.0)
Monocytes Relative: 7.4 % (ref 3.0–12.0)
Neutro Abs: 3.2 10*3/uL (ref 1.4–7.7)
Neutrophils Relative %: 58.1 % (ref 43.0–77.0)
Platelets: 241 10*3/uL (ref 150.0–400.0)
RBC: 3.93 Mil/uL (ref 3.87–5.11)
RDW: 14.5 % (ref 11.5–15.5)
WBC: 5.6 10*3/uL (ref 4.0–10.5)

## 2019-06-30 MED ORDER — PANTOPRAZOLE SODIUM 40 MG PO TBEC: 40.0000 mg | DELAYED_RELEASE_TABLET | Freq: Every day | ORAL | 3 refills | Status: DC

## 2019-06-30 MED ORDER — METOPROLOL SUCCINATE ER 25 MG PO TB24: 25.0000 mg | ORAL_TABLET | Freq: Every day | ORAL | 3 refills | Status: DC

## 2019-06-30 NOTE — Assessment & Plan Note (Signed)
We discussed age appropriate health related issues, including available/recomended screening tests and vaccinations. We discussed a need for adhering to healthy diet and exercise. Labs were ordered to be later reviewed . All questions were answered.   

## 2019-06-30 NOTE — Assessment & Plan Note (Signed)
Doing well 

## 2019-06-30 NOTE — Assessment & Plan Note (Signed)
Per Dr Jana Hakim: "Specifically we reviewed the biology of her diagnosis and the specifics of her situation.  She understands that the lupus anticoagulant is in most instances not associated with lupus, and that it is a functional procoagulant.  The diagnosis of the antiphospholipid syndrome requires not only laboratory detection of the lupus anticoagulant but also a history of miscarriages or clotting issues which are not present here.  In any case her lupus anticoagulant appears to have been transient and it is not demonstrable at this point.  Clearly she does not meet criteria for the lupus anticoagulant syndrome.  This requires no further evaluation.  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  The heterozygous presence of a mutation in this gene is common and may lead to elevations in homocystine, with controversial sequelae.  We did not obtain lab work regarding this and it may be prudent to obtain a homocystine and folate level through her primary care physician and replace folate as appropriate  She is certainly iron deficient at present although not yet anemic.  She will benefit from iron replacement, which is most easily done orally  Finally Manuela Schwartz expressed concerns regarding her younger daughter.  I have contacted the genetics department at [facility] to see if genetics testing is appropriate."

## 2019-06-30 NOTE — Patient Instructions (Addendum)
Specifically we reviewed the biology of her diagnosis and the specifics of her situation.  She understands that the lupus anticoagulant is in most instances not associated with lupus, and that it is a functional procoagulant.  The diagnosis of the antiphospholipid syndrome requires not only laboratory detection of the lupus anticoagulant but also a history of miscarriages or clotting issues which are not present here.  In any case her lupus anticoagulant appears to have been transient and it is not demonstrable at this point.  Clearly she does not meet criteria for the lupus anticoagulant syndrome.  This requires no further evaluation.  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  The heterozygous presence of a mutation in this gene is common and may lead to elevations in homocystine, with controversial sequelae.  We did not obtain lab work regarding this and it may be prudent to obtain a homocystine and folate level through her primary care physician and replace folate as appropriate  She is certainly iron deficient at present although not yet anemic.  She will benefit from iron replacement, which is most easily done orally  Finally Manuela Schwartz expressed concerns regarding her younger daughter.  I have contacted the genetics department at [facility] to see if genetics testing is appropriate.  I have not made a return appointment for Manuela Schwartz here but will be glad to see her again at any point in the future if and when the need arises.   Magrinat, Virgie Dad, MD  07/03/18 1:35 PM Medical Oncology and Hematology [facility] [address] [place], [place] [address] Tel. [phone] Fax. [phone]         Gluten free trial for 4-6 weeks. OK to use gluten-free bread and gluten-free pasta.    Gluten-Free Diet for Celiac Disease, Adult The gluten-free diet includes all foods that do not contain gluten. Gluten is  a protein that is found in wheat, rye, barley, and some other grains. Following the gluten-free diet is the only treatment for people with celiac disease. It helps to prevent damage to the intestines and improves or eliminates the symptoms of celiac disease. Following the gluten-free diet requires some planning. It can be challenging at first, but it gets easier with time and practice. There are more gluten-free options available today than ever before. If you need help finding gluten-free foods or if you have questions, talk with your diet and nutrition specialist (registered dietitian) or your health care provider. What do I need to know about a gluten-free diet?  All fruits, vegetables, and meats are safe to eat and do not contain gluten.  When grocery shopping, start by shopping in the produce, meat, and dairy sections. These sections are more likely to contain gluten-free foods. Then move to the aisles that contain packaged foods if you need to.  Read all food labels. Gluten is often added to foods. Always check the ingredient list and look for warnings, such as "may contain gluten."  Talk with your dietitian or health care provider before taking a gluten-free multivitamin or mineral supplement.  Be aware of gluten-free foods having contact with foods that contain gluten (cross-contamination). This can happen at home and with any processed foods. ? Talk with your health care provider or dietitian about how to reduce the risk of cross-contamination in your home. ? If you have questions about how a food is processed, ask the manufacturer. What key words help to identify gluten? Foods that list any of  these key words on the label usually contain gluten:  Wheat, flour, enriched flour, bromated flour, white flour, durum flour, graham flour, phosphated flour, self-rising flour, semolina, farina, barley (malt), rye, and oats.  Starch, dextrin, modified food starch, or cereal.  Thickening,  fillers, or emulsifiers.  Malt flavoring, malt extract, or malt syrup.  Hydrolyzed vegetable protein.  In the [place] packaged foods that are gluten-free are required to be labeled "GF." These foods should be easy to identify and are safe to eat. In the [place] food companies are also required to list common food allergens, including wheat, on their labels. Recommended foods Grains  Amaranth, bean flours, 100% buckwheat flour, corn, millet, nut flours or nut meals, GF oats, quinoa, rice, sorghum, teff, rice wafers, pure cornmeal tortillas, popcorn, and hot cereals made from cornmeal. Hominy, rice, wild rice. Some Asian rice noodles or bean noodles. Arrowroot starch, corn bran, corn flour, corn germ, cornmeal, corn starch, potato flour, potato starch flour, and rice bran. Plain, brown, and sweet rice flours. Rice polish, soy flour, and tapioca starch. Vegetables  All plain fresh, frozen, and canned vegetables. Fruits  All plain fresh, frozen, canned, and dried fruits, and 100% fruit juices. Meats and other protein foods  All fresh beef, pork, poultry, fish, seafood, and eggs. Fish canned in water, oil, brine, or vegetable broth. Plain nuts and seeds, peanut butter. Some lunch meat and some frankfurters. Dried beans, dried peas, and lentils. Dairy  Fresh plain, dry, evaporated, or condensed milk. Cream, butter, sour cream, whipping cream, and most yogurts. Unprocessed cheese, most processed cheeses, some cottage cheese, some cream cheeses. Beverages  Coffee, tea, most herbal teas. Carbonated beverages and some root beers. Wine, sake, and distilled spirits, such as gin, vodka, and whiskey. Most hard ciders. Fats and oils  Butter, margarine, vegetable oil, hydrogenated butter, olive oil, shortening, lard, cream, and some mayonnaise. Some commercial salad dressings. Olives. Sweets and desserts  Sugar, honey, some syrups, molasses, jelly, and jam. Plain hard candy, marshmallows, and gumdrops.  Pure cocoa powder. Plain chocolate. Custard and some pudding mixes. Gelatin desserts, sorbets, frozen ice pops, and sherbet. Cake, cookies, and other desserts prepared with allowed flours. Some commercial ice creams. Cornstarch, tapioca, and rice puddings. Seasoning and other foods  Some canned or frozen soups. Monosodium glutamate (MSG). Cider, rice, and wine vinegar. Baking soda and baking powder. Cream of tartar. Baking and nutritional yeast. Certain soy sauces made without wheat (ask your dietitian about specific brands that are allowed). Nuts, coconut, and chocolate. Salt, pepper, herbs, spices, flavoring extracts, imitation or artificial flavorings, natural flavorings, and food colorings. Some medicines and supplements. Some lip glosses and other cosmetics. Rice syrups. The items listed may not be a complete list. Talk with your dietitian about what dietary choices are best for you. Foods to avoid Grains  Barley, bran, bulgur, couscous, cracked wheat, Ewing, farro, graham, malt, matzo, semolina, wheat germ, and all wheat and rye cereals including spelt and kamut. Cereals containing malt as a flavoring, such as rice cereal. Noodles, spaghetti, macaroni, most packaged rice mixes, and all mixes containing wheat, rye, barley, or triticale. Vegetables  Most creamed vegetables and most vegetables canned in sauces. Some commercially prepared vegetables and salads. Fruits  Thickened or prepared fruits and some pie fillings. Some fruit snacks and fruit roll-ups. Meats and other protein foods  Any meat or meat alternative containing wheat, rye, barley, or gluten stabilizers. These are often marinated or packaged meats and lunch meats. Bread-containing products, such as Swiss steak,  croquettes, meatballs, and meatloaf. Most tuna canned in vegetable broth and [place]urkey with hydrolyzed vegetable protein (HVP) injected as part of the basting. Seitan. Imitation fish. Eggs in sauces made from ingredients to  avoid. Dairy  Commercial chocolate milk drinks and malted milk. Some non-dairy creamers. Any cheese product containing ingredients to avoid. Beverages  Certain cereal beverages. Beer, ale, malted milk, and some root beers. Some hard ciders. Some instant flavored coffees. Some herbal teas made with barley or with barley malt added. Fats and oils  Some commercial salad dressings. Sour cream containing modified food starch. Sweets and desserts  Some toffees. Chocolate-coated nuts (may be rolled in wheat flour) and some commercial candies and candy bars. Most cakes, cookies, donuts, pastries, and other baked goods. Some commercial ice cream. Ice cream cones. Commercially prepared mixes for cakes, cookies, and other desserts. Bread pudding and other puddings thickened with flour. Products containing brown rice syrup made with barley malt enzyme. Desserts and sweets made with malt flavoring. Seasoning and other foods  Some curry powders, some dry seasoning mixes, some gravy extracts, some meat sauces, some ketchups, some prepared mustards, and horseradish. Certain soy sauces. Malt vinegar. Bouillon and bouillon cubes that contain HVP. Some chip dips, and some chewing gum. Yeast extract. Brewer's yeast. Caramel color. Some medicines and supplements. Some lip glosses and other cosmetics. The items listed may not be a complete list. Talk with your dietitian about what dietary choices are best for you. Summary  Gluten is a protein that is found in wheat, rye, barley, and some other grains. The gluten-free diet includes all foods that do not contain gluten.  If you need help finding gluten-free foods or if you have questions, talk with your diet and nutrition specialist (registered dietitian) or your health care provider.  Read all food labels. Gluten is often added to foods. Always check the ingredient list and look for warnings, such as "may contain gluten." This information is not intended to replace  advice given to you by your health care provider. Make sure you discuss any questions you have with your health care provider. Document Released: 06/30/2005 Document Revised: 04/14/2016 Document Reviewed: 04/14/2016 Elsevier Interactive Patient Education  2018 [employer]

## 2019-06-30 NOTE — Assessment & Plan Note (Signed)
Better  

## 2019-06-30 NOTE — Progress Notes (Signed)
Subjective:  Patient ID: Pamela Thompson, female    DOB: 1976/01/07  Age: 43 y.o. MRN: [id number]  CC: No chief complaint on file.   HPI SONNI BARSE presents for a well exam F/u anemia, fatigue Poor appetite  Outpatient Medications Prior to Visit  Medication Sig Dispense Refill  . Ascorbic Acid (VITAMIN C PO) Take 1 tablet by mouth daily.    Marland Kitchen aspirin (BAYER ASPIRIN) 325 MG tablet Take 1 tablet for prolonged trips 100 tablet 3  . b complex vitamins capsule Take 1 capsule by mouth every other day.    . Cholecalciferol (VITAMIN D3) 2000 units TABS Take 1 tablet by mouth every other day.     . metoprolol succinate (TOPROL-XL) 25 MG 24 hr tablet Take 1 tablet (25 mg total) by mouth daily. Must keep schedule appt for future refills 90 tablet 3  . pantoprazole (PROTONIX) 40 MG tablet Take 1 tablet (40 mg total) by mouth daily before breakfast. 90 tablet 3   No facility-administered medications prior to visit.    ROS: Review of Systems  Constitutional: Negative for activity change, appetite change, chills, fatigue and unexpected weight change.  HENT: Negative for congestion, mouth sores and sinus pressure.   Eyes: Negative for visual disturbance.  Respiratory: Negative for cough and chest tightness.   Gastrointestinal: Negative for abdominal pain and nausea.  Genitourinary: Negative for difficulty urinating, frequency and vaginal pain.  Musculoskeletal: Negative for back pain and gait problem.  Skin: Negative for pallor and rash.  Neurological: Negative for dizziness, tremors, weakness, numbness and headaches.  Psychiatric/Behavioral: Negative for confusion, sleep disturbance and suicidal ideas.    Objective:  BP 122/74 (BP Location: Left Arm, Patient Position: Sitting, Cuff Size: Normal)   Pulse 66   Temp 98 F (36.7 C) (Oral)   Ht 5' 3.5" (1.613 m)   Wt 163 lb (73.9 kg)   SpO2 99%   BMI 28.42 kg/m   BP Readings from Last 3 Encounters:  06/30/19 122/74  10/06/18  128/84  07/02/18 126/75    Wt Readings from Last 3 Encounters:  06/30/19 163 lb (73.9 kg)  07/02/18 169 lb 9.6 oz (76.9 kg)  06/14/18 170 lb (77.1 kg)    Physical Exam Constitutional:      General: She is not in acute distress.    Appearance: She is well-developed.  HENT:     Head: Normocephalic.     Right Ear: External ear normal.     Left Ear: External ear normal.     Nose: Nose normal.  Eyes:     General:        Right eye: No discharge.        Left eye: No discharge.     Conjunctiva/sclera: Conjunctivae normal.     Pupils: Pupils are equal, round, and reactive to light.  Neck:     Thyroid: No thyromegaly.     Vascular: No JVD.     Trachea: No tracheal deviation.  Cardiovascular:     Rate and Rhythm: Normal rate and regular rhythm.     Heart sounds: Normal heart sounds.  Pulmonary:     Effort: No respiratory distress.     Breath sounds: No stridor. No wheezing.  Abdominal:     General: Bowel sounds are normal. There is no distension.     Palpations: Abdomen is soft. There is no mass.     Tenderness: There is no abdominal tenderness. There is no guarding or rebound.  Musculoskeletal:  General: No tenderness.     Cervical back: Normal range of motion and neck supple.  Lymphadenopathy:     Cervical: No cervical adenopathy.  Skin:    Findings: No erythema or rash.  Neurological:     Cranial Nerves: No cranial nerve deficit.     Motor: No abnormal muscle tone.     Coordination: Coordination normal.     Deep Tendon Reflexes: Reflexes normal.  Psychiatric:        Behavior: Behavior normal.        Thought Content: Thought content normal.        Judgment: Judgment normal.     Lab Results  Component Value Date   WBC 4.9 11/12/2018   HGB 11.6 (L) 11/12/2018   HCT 34.7 (L) 11/12/2018   PLT 293.0 11/12/2018   GLUCOSE 99 11/12/2018   CHOL 226 (H) 06/14/2018   TRIG 72.0 06/14/2018   HDL 58.10 06/14/2018   LDLCALC 153 (H) 06/14/2018   ALT 11 11/12/2018    AST 17 11/12/2018   NA 138 11/12/2018   K 3.9 11/12/2018   CL 103 11/12/2018   CREATININE 0.73 11/12/2018   BUN 8 11/12/2018   CO2 26 11/12/2018   TSH 1.68 11/12/2018   INR 0.88 07/02/2018    DG Chest Port 1 View  Result Date: 10/06/2018 CLINICAL DATA:  Cough with chest burning. EXAM: PORTABLE CHEST 1 VIEW COMPARISON:  None. FINDINGS: 1828 hours. Two views obtained. The heart size and mediastinal contours are normal. The lungs are clear. There is no pleural effusion or pneumothorax. No acute osseous findings are identified. IMPRESSION: No active cardiopulmonary process. Electronically Signed   By: Carey Bullocks M.D.   On: 10/06/2018 19:10    Assessment & Plan:   There are no diagnoses linked to this encounter.   No orders of the defined types were placed in this encounter.    Follow-up: No follow-ups on file.  Sonda Primes, MD

## 2019-07-01 LAB — IRON,TIBC AND FERRITIN PANEL
%SAT: 10 % (calc) — ABNORMAL LOW (ref 16–45)
Ferritin: 5 ng/mL — ABNORMAL LOW (ref 16–232)
Iron: 39 ug/dL — ABNORMAL LOW (ref 40–190)
TIBC: 402 mcg/dL (calc) (ref 250–450)

## 2019-08-16 ENCOUNTER — Ambulatory Visit: Payer: 59 | Attending: Internal Medicine

## 2019-08-16 DIAGNOSIS — Z20822 Contact with and (suspected) exposure to covid-19: Secondary | ICD-10-CM | POA: Insufficient documentation

## 2019-08-17 LAB — NOVEL CORONAVIRUS, NAA: SARS-CoV-2, NAA: NOT DETECTED

## 2019-08-19 ENCOUNTER — Ambulatory Visit: Payer: 59 | Attending: Internal Medicine

## 2019-08-19 DIAGNOSIS — Z20822 Contact with and (suspected) exposure to covid-19: Secondary | ICD-10-CM | POA: Insufficient documentation

## 2019-08-20 LAB — NOVEL CORONAVIRUS, NAA: SARS-CoV-2, NAA: NOT DETECTED

## 2019-10-10 ENCOUNTER — Other Ambulatory Visit: Payer: Self-pay | Admitting: Internal Medicine

## 2019-10-10 DIAGNOSIS — R002 Palpitations: Secondary | ICD-10-CM

## 2019-10-31 ENCOUNTER — Ambulatory Visit: Payer: Self-pay | Admitting: Internal Medicine

## 2020-06-14 MED ORDER — METOPROLOL SUCCINATE ER 25 MG PO TB24: 25.0000 mg | ORAL_TABLET | Freq: Every day | ORAL | 0 refills | Status: DC

## 2020-06-28 ENCOUNTER — Ambulatory Visit (INDEPENDENT_AMBULATORY_CARE_PROVIDER_SITE_OTHER): Payer: 59 | Admitting: Internal Medicine

## 2020-06-28 ENCOUNTER — Other Ambulatory Visit: Payer: Self-pay

## 2020-06-28 ENCOUNTER — Encounter: Payer: Self-pay | Admitting: Internal Medicine

## 2020-06-28 VITALS — BP 120/78 | HR 77 | Temp 98.9°F | Ht 63.5 in | Wt 173.8 lb

## 2020-06-28 DIAGNOSIS — R5383 Other fatigue: Secondary | ICD-10-CM

## 2020-06-28 DIAGNOSIS — D500 Iron deficiency anemia secondary to blood loss (chronic): Secondary | ICD-10-CM | POA: Diagnosis not present

## 2020-06-28 DIAGNOSIS — Z0000 Encounter for general adult medical examination without abnormal findings: Secondary | ICD-10-CM | POA: Diagnosis not present

## 2020-06-28 LAB — CBC WITH DIFFERENTIAL/PLATELET
Basophils Absolute: 0 10*3/uL (ref 0.0–0.1)
Basophils Relative: 0.3 % (ref 0.0–3.0)
Eosinophils Absolute: 0.1 10*3/uL (ref 0.0–0.7)
Eosinophils Relative: 1.3 % (ref 0.0–5.0)
HCT: 34.3 % — ABNORMAL LOW (ref 36.0–46.0)
Hemoglobin: 11.3 g/dL — ABNORMAL LOW (ref 12.0–15.0)
Lymphocytes Relative: 33.5 % (ref 12.0–46.0)
Lymphs Abs: 2 10*3/uL (ref 0.7–4.0)
MCHC: 32.8 g/dL (ref 30.0–36.0)
MCV: 83 fl (ref 78.0–100.0)
Monocytes Absolute: 0.4 10*3/uL (ref 0.1–1.0)
Monocytes Relative: 7.4 % (ref 3.0–12.0)
Neutro Abs: 3.4 10*3/uL (ref 1.4–7.7)
Neutrophils Relative %: 57.5 % (ref 43.0–77.0)
Platelets: 296 10*3/uL (ref 150.0–400.0)
RBC: 4.13 Mil/uL (ref 3.87–5.11)
RDW: 15.4 % (ref 11.5–15.5)
WBC: 5.8 10*3/uL (ref 4.0–10.5)

## 2020-06-28 LAB — COMPREHENSIVE METABOLIC PANEL
ALT: 12 U/L (ref 0–35)
AST: 20 U/L (ref 0–37)
Albumin: 4.5 g/dL (ref 3.5–5.2)
Alkaline Phosphatase: 36 U/L — ABNORMAL LOW (ref 39–117)
BUN: 13 mg/dL (ref 6–23)
CO2: 26 mEq/L (ref 19–32)
Calcium: 9.6 mg/dL (ref 8.4–10.5)
Chloride: 102 mEq/L (ref 96–112)
Creatinine, Ser: 0.89 mg/dL (ref 0.40–1.20)
GFR: 78.06 mL/min (ref >60.00)
Glucose, Bld: 83 mg/dL (ref 70–99)
Potassium: 3.7 mEq/L (ref 3.5–5.1)
Sodium: 135 mEq/L (ref 135–145)
Total Bilirubin: 0.6 mg/dL (ref 0.2–1.2)
Total Protein: 7.7 g/dL (ref 6.0–8.3)

## 2020-06-28 LAB — URINALYSIS
Bilirubin Urine: NEGATIVE
Hgb urine dipstick: NEGATIVE
Ketones, ur: NEGATIVE
Leukocytes,Ua: NEGATIVE
Nitrite: NEGATIVE
Specific Gravity, Urine: 1.015 (ref 1.000–1.030)
Total Protein, Urine: NEGATIVE
Urine Glucose: NEGATIVE
Urobilinogen, UA: 0.2 (ref 0.0–1.0)
pH: 5.5 (ref 5.0–8.0)

## 2020-06-28 LAB — LIPID PANEL
Cholesterol: 190 mg/dL (ref 0–200)
HDL: 61.3 mg/dL (ref >39.00)
LDL Cholesterol: 117 mg/dL — ABNORMAL HIGH (ref 0–99)
NonHDL: 128.39
Total CHOL/HDL Ratio: 3
Triglycerides: 59 mg/dL (ref 0.0–149.0)
VLDL: 11.8 mg/dL (ref 0.0–40.0)

## 2020-06-28 LAB — VITAMIN B12: Vitamin B-12: 685 pg/mL (ref 211–911)

## 2020-06-28 LAB — VITAMIN D 25 HYDROXY (VIT D DEFICIENCY, FRACTURES): VITD: 47.41 ng/mL (ref 30.00–100.00)

## 2020-06-28 LAB — TSH: TSH: 2.17 u[IU]/mL (ref 0.35–4.50)

## 2020-06-28 MED ORDER — PANTOPRAZOLE SODIUM 40 MG PO TBEC: 40.0000 mg | DELAYED_RELEASE_TABLET | Freq: Every day | ORAL | 3 refills | Status: DC

## 2020-06-28 MED ORDER — METOPROLOL SUCCINATE ER 25 MG PO TB24: 25.0000 mg | ORAL_TABLET | Freq: Every day | ORAL | 3 refills | Status: DC

## 2020-06-28 NOTE — Progress Notes (Signed)
Subjective:  Patient ID: Pamela Thompson, female    DOB: [birth year]  Age: 44 y.o. MRN: [id number]  CC: Annual Exam   HPI Pamela Thompson presents for a well exam Heavy periods, irregular at times Pamela Thompson continues to be tired at times   Outpatient Medications Prior to Visit  Medication Sig Dispense Refill  . Ascorbic Acid (VITAMIN C PO) Take 1 tablet by mouth daily.    Marland Kitchen aspirin (BAYER ASPIRIN) 325 MG tablet Take 1 tablet for prolonged trips 100 tablet 3  . b complex vitamins capsule Take 1 capsule by mouth every other day.    . Cholecalciferol (VITAMIN D3) 2000 units TABS Take 1 tablet by mouth daily.    . metoprolol succinate (TOPROL-XL) 25 MG 24 hr tablet Take 1 tablet (25 mg total) by mouth daily. Must keep schedule appt for future refills 30 tablet 0  . pantoprazole (PROTONIX) 40 MG tablet Take 1 tablet (40 mg total) by mouth daily before breakfast. 90 tablet 3   No facility-administered medications prior to visit.    ROS: Review of Systems  Constitutional: Positive for fatigue. Negative for activity change, appetite change, chills and unexpected weight change.  HENT: Negative for congestion, mouth sores and sinus pressure.   Eyes: Negative for visual disturbance.  Respiratory: Negative for cough and chest tightness.   Gastrointestinal: Negative for abdominal pain and nausea.  Genitourinary: Negative for difficulty urinating, frequency and vaginal pain.  Musculoskeletal: Negative for back pain and gait problem.  Skin: Negative for pallor and rash.  Neurological: Negative for dizziness, tremors, weakness, numbness and headaches.  Psychiatric/Behavioral: Positive for sleep disturbance. Negative for confusion.    Objective:  BP 120/78 (BP Location: Left Arm)   Pulse 77   Temp 98.9 F (37.2 C) (Oral)   Ht 5' 3.5" (1.613 m)   Wt 173 lb 12.8 oz (78.8 kg)   SpO2 99%   BMI 30.30 kg/m   BP Readings from Last 3 Encounters:  06/28/20 120/78  06/30/19 122/74  10/06/18  128/84    Wt Readings from Last 3 Encounters:  06/28/20 173 lb 12.8 oz (78.8 kg)  06/30/19 163 lb (73.9 kg)  07/02/18 169 lb 9.6 oz (76.9 kg)    Physical Exam Constitutional:      General: She is not in acute distress.    Appearance: She is well-developed.  HENT:     Head: Normocephalic.     Right Ear: External ear normal.     Left Ear: External ear normal.     Nose: Nose normal.     Mouth/Throat:     Mouth: Oropharynx is clear and moist.  Eyes:     General:        Right eye: No discharge.        Left eye: No discharge.     Conjunctiva/sclera: Conjunctivae normal.     Pupils: Pupils are equal, round, and reactive to light.  Neck:     Thyroid: No thyromegaly.     Vascular: No JVD.     Trachea: No tracheal deviation.  Cardiovascular:     Rate and Rhythm: Normal rate and regular rhythm.     Heart sounds: Normal heart sounds.  Pulmonary:     Effort: No respiratory distress.     Breath sounds: No stridor. No wheezing.  Abdominal:     General: Bowel sounds are normal. There is no distension.     Palpations: Abdomen is soft. There is no mass.  Tenderness: There is no abdominal tenderness. There is no guarding or rebound.  Musculoskeletal:        General: No tenderness or edema.     Cervical back: Normal range of motion and neck supple.  Lymphadenopathy:     Cervical: No cervical adenopathy.  Skin:    Findings: No erythema or rash.  Neurological:     Cranial Nerves: No cranial nerve deficit.     Motor: No abnormal muscle tone.     Coordination: Coordination normal.     Deep Tendon Reflexes: Reflexes normal.  Psychiatric:        Mood and Affect: Mood and affect normal.        Behavior: Behavior normal.        Thought Content: Thought content normal.        Judgment: Judgment normal.     Lab Results  Component Value Date   WBC 5.6 06/30/2019   HGB 11.8 (L) 06/30/2019   HCT 35.1 (L) 06/30/2019   PLT 241.0 06/30/2019   GLUCOSE 98 06/30/2019   CHOL 226 (H)  06/14/2018   TRIG 72.0 06/14/2018   HDL 58.10 06/14/2018   LDLCALC 153 (H) 06/14/2018   ALT 15 06/30/2019   AST 21 06/30/2019   NA 137 06/30/2019   K 4.0 06/30/2019   CL 103 06/30/2019   CREATININE 0.79 06/30/2019   BUN 12 06/30/2019   CO2 28 06/30/2019   TSH 2.23 06/30/2019   INR 0.88 07/02/2018    DG Chest Port 1 View  Result Date: 10/06/2018 CLINICAL DATA:  Cough with chest burning. EXAM: PORTABLE CHEST 1 VIEW COMPARISON:  None. FINDINGS: 1828 hours. Two views obtained. The heart size and mediastinal contours are normal. The lungs are clear. There is no pleural effusion or pneumothorax. No acute osseous findings are identified. IMPRESSION: No active cardiopulmonary process. Electronically Signed   By: Carey Bullocks M.D.   On: 10/06/2018 19:10    Assessment & Plan:        Follow-up: Return in about 1 year (around 06/28/2021) for a follow-up visit.  Sonda Primes, MD

## 2020-06-28 NOTE — Assessment & Plan Note (Addendum)
CBC and iron tests were ordered.  Ferritin remains very low.  She may need an iron transfusion.  She is intolerant of oral iron tablets.  We may need to refer her to hematology.

## 2020-06-28 NOTE — Assessment & Plan Note (Signed)

## 2020-06-28 NOTE — Patient Instructions (Signed)
You can try Lion's Mane Mushroom capsules for memory, focus, neuropathy ([employer] or [employer])   

## 2020-06-29 LAB — IRON,TIBC AND FERRITIN PANEL
%SAT: 18 % (calc) (ref 16–45)
Ferritin: 5 ng/mL — ABNORMAL LOW (ref 16–232)
Iron: 86 ug/dL (ref 40–190)
TIBC: 472 mcg/dL (calc) — ABNORMAL HIGH (ref 250–450)

## 2020-11-21 ENCOUNTER — Other Ambulatory Visit: Payer: Self-pay | Admitting: Internal Medicine

## 2020-11-21 ENCOUNTER — Other Ambulatory Visit: Payer: Self-pay

## 2020-11-21 ENCOUNTER — Ambulatory Visit: Payer: 59 | Attending: Sports Medicine | Admitting: Occupational Therapy

## 2020-11-21 DIAGNOSIS — R29818 Other symptoms and signs involving the nervous system: Secondary | ICD-10-CM | POA: Insufficient documentation

## 2020-11-21 DIAGNOSIS — M79601 Pain in right arm: Secondary | ICD-10-CM | POA: Insufficient documentation

## 2020-11-21 DIAGNOSIS — M79631 Pain in right forearm: Secondary | ICD-10-CM | POA: Insufficient documentation

## 2020-11-21 DIAGNOSIS — M25521 Pain in right elbow: Secondary | ICD-10-CM | POA: Insufficient documentation

## 2020-11-21 DIAGNOSIS — M6281 Muscle weakness (generalized): Secondary | ICD-10-CM | POA: Diagnosis present

## 2020-11-21 DIAGNOSIS — R29898 Other symptoms and signs involving the musculoskeletal system: Secondary | ICD-10-CM | POA: Insufficient documentation

## 2020-11-21 DIAGNOSIS — D500 Iron deficiency anemia secondary to blood loss (chronic): Secondary | ICD-10-CM

## 2020-11-22 ENCOUNTER — Telehealth: Payer: Self-pay | Admitting: Oncology

## 2020-11-22 NOTE — Therapy (Signed)
[facility] [address] [place], [place], [address] Phone: [phone]   Fax:  [phone]  Occupational Therapy Evaluation  Patient Details  Name: Pamela Thompson MRN: [id number] Date of Birth: 1975/08/30 Referring Provider (OT): Rodolph Bong, MD Raechel Chute)   Encounter Date: 11/21/2020   OT End of Session - 11/22/20 1752    Visit Number 1    Number of Visits 9    Date for OT Re-Evaluation 01/20/21    Authorization Type [employer]    Authorization Time Period VL: 60 (comb)/60 remain - add'l avail w/ [place] review    OT Start Time 1655    OT Stop Time 1745    OT Time Calculation (min) 50 min    Activity Tolerance Patient tolerated treatment well;Patient limited by pain    Behavior During Therapy [facility] for tasks assessed/performed           Past Medical History:  Diagnosis Date  . Allergy   . Clotting disorder (HCC)    2n pregancy intrauterinal blood clot  . Eosinophilic esophagitis 08/05/2017  . GERD (gastroesophageal reflux disease)   . Hx of blood clots    uterine  . Maternal APL (antiphospholipid antibody syndrome) (HCC)   . POTS (postural orthostatic tachycardia syndrome)     Past Surgical History:  Procedure Laterality Date  . COLONOSCOPY  2019   Multiple due to family history of colon cancer so far negative  . ESOPHAGOGASTRODUODENOSCOPY     2013 - [place]  . LASIK Bilateral   . MOHS SURGERY     [place]head  . UPPER GASTROINTESTINAL ENDOSCOPY     Multiple, eosinophilic esophagitis diagnosed 2019    There were no vitals filed for this visit.   Subjective Assessment - 11/21/20 1657    Subjective  Pt arrived today with primary complaint of R elbow and forearm pain that she states started approx. 6-8 weeks ago. Per pt report, the pain originated in her elbow and then moved distally to include R forearm; recently, within approx the last 2 weeks, pain has also moved proximally to her upper arm. Pt states her  RUE typically feels okay at rest and the primary aggravating factors include elbow extension and some forearm/wrist movements, particularly against resistance, as well as gripping and holding with her R hand.    Pertinent History POTS; anti-cardiolipin antibody syndrome (blood clotting disorder, per pt report)    Repetition Increases Symptoms    Currently in Pain? No/denies   Reports pain w/ movement and grip            [facility] OT Assessment - 11/21/20 1709      Assessment   Medical Diagnosis Pain in right elbow    Referring Provider (OT) Rodolph Bong, MD   [facility]   Hand Dominance Right    Prior Therapy No      Precautions   Precautions None    Required Braces or Orthoses Other Brace/Splint    Other Brace/Splint Prefabricated R forearm-based immobilization orthosis      Restrictions   Weight Bearing Restrictions No      Balance Screen   Has the patient fallen in the past 6 months No      Home  Environment   Family/patient expects to be discharged to: Private residence    Lives With Family   Husband, 2 kids (teenagers), 3 dogs     Prior Function   Level of Independence Independent    Vocation Full time  [facility] [address] [place], [place], [address] Phone: [phone]   Fax:  [phone]  Occupational Therapy Evaluation  Patient Details  Name: Pamela Thompson MRN: [id number] Date of Birth: 1975/08/30 Referring Provider (OT): Rodolph Bong, MD Raechel Chute)   Encounter Date: 11/21/2020   OT End of Session - 11/22/20 1752    Visit Number 1    Number of Visits 9    Date for OT Re-Evaluation 01/20/21    Authorization Type [employer]    Authorization Time Period VL: 60 (comb)/60 remain - [place] avail w/ [place]    OT Start Time 1655    OT Stop Time 1745    OT Time Calculation (min) 50 min    Activity Tolerance Patient tolerated treatment well;Patient limited by pain    Behavior During Therapy [facility] for tasks assessed/performed           Past Medical History:  Diagnosis Date  . Allergy   . Clotting disorder (HCC)    2n pregancy intrauterinal blood clot  . Eosinophilic esophagitis 08/05/2017  . GERD (gastroesophageal reflux disease)   . Hx of blood clots    uterine  . Maternal APL (antiphospholipid antibody syndrome) (HCC)   . POTS (postural orthostatic tachycardia syndrome)     Past Surgical History:  Procedure Laterality Date  . COLONOSCOPY  2019   Multiple due to family history of colon cancer so far negative  . ESOPHAGOGASTRODUODENOSCOPY     2013 - [place]  . LASIK Bilateral   . MOHS SURGERY     [place]  . UPPER GASTROINTESTINAL ENDOSCOPY     Multiple, eosinophilic esophagitis diagnosed 2019    There were no vitals filed for this visit.   Subjective Assessment - 11/21/20 1657    Subjective  Pt arrived today with primary complaint of R elbow and forearm pain that she states started approx. 6-8 weeks ago. Per pt report, the pain originated in her elbow and then moved distally to include R forearm; recently, within approx the last 2 weeks, pain has also moved proximally to her upper arm. Pt states her  RUE typically feels okay at rest and the primary aggravating factors include elbow extension and some forearm/wrist movements, particularly against resistance, as well as gripping and holding with her R hand.    Pertinent History POTS; anti-cardiolipin antibody syndrome (blood clotting disorder, per pt report)    Repetition Increases Symptoms    Currently in Pain? No/denies   Reports pain w/ movement and grip            [facility] OT Assessment - 11/21/20 1709      Assessment   Medical Diagnosis Pain in right elbow    Referring Provider (OT) Rodolph Bong, MD   [facility]   Hand Dominance Right    Prior Therapy No      Precautions   Precautions None    Required Braces or Orthoses Other Brace/Splint    Other Brace/Splint Prefabricated R forearm-based immobilization orthosis      Restrictions   Weight Bearing Restrictions No      Balance Screen   Has the patient fallen in the past 6 months No      Home  Environment   Family/patient expects to be discharged to: Private residence    Lives With Family   Husband, 2 kids (teenagers), 3 dogs     Prior Function   Level of Independence Independent    Vocation Full time  in order to improve the following deficits and impairments:   Body Structure / Function / Physical Skills: ADL,Decreased knowledge of use of DME,Strength,Pain,Body mechanics,UE functional use,ROM,IADL,Sensation,Edema       Visit Diagnosis: Pain in right elbow  Pain in right forearm  Pain in right arm  Muscle weakness (generalized)  Other symptoms and signs involving the musculoskeletal system  Other symptoms and signs involving the nervous system    Problem List Patient Active Problem List    Diagnosis Date Noted  . Fatigue 11/09/2018  . Iron deficiency anemia 11/09/2018  . MTHFR (methylene THF reductase) deficiency and homocystinuria (HCC) 07/02/2018  . Heavy periods 06/14/2018  . Right calf pain 10/29/2017  . Eosinophilic esophagitis 08/05/2017  . Family history of colon cancer 06/08/2017  . Upper respiratory infection 06/08/2017  . Heart palpitations 07/29/2016  . Well adult exam 05/26/2016  . POTS (postural orthostatic tachycardia syndrome) 05/26/2016  . Grief 05/26/2016  . Anti-cardiolipin antibody syndrome (HCC) 05/26/2016     Rosie FateKelsey Rubi Tooley, OTR/L, MSOT 11/22/2020, 6:15 PM  [facility] [address] [place], [place], [address] Phone: [phone]   Fax:  [phone]  Name: Pamela Thompson MRN: [id number] Date of Birth: 04/19/1976  in order to improve the following deficits and impairments:   Body Structure / Function / Physical Skills: ADL,Decreased knowledge of use of DME,Strength,Pain,Body mechanics,UE functional use,ROM,IADL,Sensation,Edema       Visit Diagnosis: Pain in right elbow  Pain in right forearm  Pain in right arm  Muscle weakness (generalized)  Other symptoms and signs involving the musculoskeletal system  Other symptoms and signs involving the nervous system    Problem List Patient Active Problem List    Diagnosis Date Noted  . Fatigue 11/09/2018  . Iron deficiency anemia 11/09/2018  . MTHFR (methylene THF reductase) deficiency and homocystinuria (HCC) 07/02/2018  . Heavy periods 06/14/2018  . Right calf pain 10/29/2017  . Eosinophilic esophagitis 08/05/2017  . Family history of colon cancer 06/08/2017  . Upper respiratory infection 06/08/2017  . Heart palpitations 07/29/2016  . Well adult exam 05/26/2016  . POTS (postural orthostatic tachycardia syndrome) 05/26/2016  . Grief 05/26/2016  . Anti-cardiolipin antibody syndrome (HCC) 05/26/2016     Rosie FateKelsey Rubi Tooley, OTR/L, MSOT 11/22/2020, 6:15 PM  [facility] [address] [place], [place], [address] Phone: [phone]   Fax:  [phone]  Name: Pamela Thompson MRN: [id number] Date of Birth: 04/19/1976

## 2020-11-22 NOTE — Telephone Encounter (Signed)
Scheduled appts per 5/12 sch msg. Called pt, no answer. Left msg with appts date and times.  

## 2020-11-28 ENCOUNTER — Other Ambulatory Visit: Payer: Self-pay

## 2020-11-28 ENCOUNTER — Ambulatory Visit: Payer: 59 | Admitting: Occupational Therapy

## 2020-11-28 DIAGNOSIS — M25521 Pain in right elbow: Secondary | ICD-10-CM

## 2020-11-28 DIAGNOSIS — M79631 Pain in right forearm: Secondary | ICD-10-CM

## 2020-11-28 DIAGNOSIS — M79601 Pain in right arm: Secondary | ICD-10-CM

## 2020-11-28 DIAGNOSIS — R29818 Other symptoms and signs involving the nervous system: Secondary | ICD-10-CM

## 2020-11-29 ENCOUNTER — Ambulatory Visit: Payer: 59 | Admitting: Occupational Therapy

## 2020-11-29 NOTE — Therapy (Signed)
[facility] [address] [place], [place], [address] Phone: [phone]   Fax:  [phone]  Occupational Therapy Treatment  Patient Details  Name: Pamela Thompson MRN: [id number] Date of Birth: October 26, 1975 Referring Provider (OT): Rodolph Bong, MD Raechel Chute)   Encounter Date: 11/28/2020   OT End of Session - 11/28/20 1715    Visit Number 2    Number of Visits 9    Date for OT Re-Evaluation 01/20/21    Authorization Type [employer]    Authorization Time Period VL: 60 (comb)/60 remain - [place] w/ [place]    OT Start Time 1708    OT Stop Time 1753    OT Time Calculation (min) 45 min    Activity Tolerance Patient tolerated treatment well    Behavior During Therapy [facility] for tasks assessed/performed           Past Medical History:  Diagnosis Date  . Allergy   . Clotting disorder (HCC)    2n pregancy intrauterinal blood clot  . Eosinophilic esophagitis 08/05/2017  . GERD (gastroesophageal reflux disease)   . Hx of blood clots    uterine  . Maternal APL (antiphospholipid antibody syndrome) (HCC)   . POTS (postural orthostatic tachycardia syndrome)     Past Surgical History:  Procedure Laterality Date  . COLONOSCOPY  2019   Multiple due to family history of colon cancer so far negative  . ESOPHAGOGASTRODUODENOSCOPY     2013 - [place]  . LASIK Bilateral   . MOHS SURGERY     [place]  . UPPER GASTROINTESTINAL ENDOSCOPY     Multiple, eosinophilic esophagitis diagnosed 2019    There were no vitals filed for this visit.   Subjective Assessment - 11/28/20 1712    Subjective  Pt states she feels like the immobilization orthosis has been helpful (she has been wearing it consistently) and that she has started taking prednisone    Pertinent History POTS; anti-cardiolipin antibody syndrome (blood clotting disorder, per pt report)    Repetition Increases Symptoms    Currently in Pain? No/denies            OT Treatment Condition-specific education provided and OT instructed pt through radial nerve glides, providing education on alignment, pacing, benefit, and goal for including exercises in pt's HEP. Pt returned demo, reporting some discomfort in her neck; OT discussed importance of not reproducing symptoms during exercises and pt was receptive       OT Education - 11/28/20 1800    Education Details OT completed screening for lateral epicondylitis w/ OT providing interpretation of results; Education also provided on aggravating movements to avoid    Person(s) Educated Patient    Methods Explanation    Comprehension Verbalized understanding            OT Short Term Goals - 11/28/20 1800      OT SHORT TERM GOAL #1   Title Pt will independently identify movements to avoid (extension/supination of wrist/forearm) to assist w/ decreasing symptoms    Baseline Decreased knowledge of precautions    Time 2    Period Weeks    Status On-going    Target Date 12/05/20      OT SHORT TERM GOAL #2   Title Pt will be independent with initial HEP including nerve gliding and gentle massage    Baseline No HEP at this time    Time 3    Period Weeks    Status On-going  Target Date 12/12/20      OT SHORT TERM GOAL #3   Title Pt will be independent with wear and care of orthosis prn    Baseline Prefabricated orthosis; may require custom?    Time 4    Period Weeks    Status On-going    Target Date 12/19/20             OT Long Term Goals - 11/28/20 1800      OT LONG TERM GOAL #1   Title Pt will be independent with full HEP designed to alleviate symptoms and increase functional use of RUE by d/c    Baseline No HEP at this time    Time 8    Period Weeks    Status On-going      OT LONG TERM GOAL #2   Title Pt will improve participation in home maintenance tasks as evidenced by increasing grip strength by at least 9#    Baseline R hand, dominant (36 lbs); L hand (67 lbs)    Time 8     Period Weeks    Status On-going      OT LONG TERM GOAL #3   Title Per self-report, pt will be able to participate in work-related tasks, incorporating compensatory strategies prn, w/ pain less than 3/10 consistently    Baseline Increased pain w/ work-related tasks    Time 8    Period Weeks    Status On-going      OT LONG TERM GOAL #4   Title Pt will increase strength of R (dominant) elbow flexion/extension to at least 4/5 to improve participation in home maintenance activities    Baseline R elbow flex/ext 4-/5    Time 8    Period Weeks    Status On-going             Plan - 11/28/20 1730    Clinical Impression Statement Due to pt's report regarding significance of pain, OT administered QuickDASH, which indicated moderate functional impairment in the generalized questionnaire (31.8) and the work module (50). OT also implemented HEP including radial nerve glides, w/ patient returning demo/completing each exercise, and gentle soft tissue massage proximal and distal to the radial tunnel. Pt also discussed concern regarding practicality of current pre-fabricated brace during work tasks, so OT discussed potential for custom immobilization orthosis w/ pt.    OT Occupational Profile and History Problem Focused Assessment - Including review of records relating to presenting problem    Occupational performance deficits (Please refer to evaluation for details): ADL's;IADL's;Rest and Sleep;Work;Leisure    Body Structure / Function / Physical Skills ADL;Decreased knowledge of use of DME;Strength;Pain;Body mechanics;UE functional use;ROM;IADL;Sensation;Edema    Rehab Potential Good    Clinical Decision Making Several treatment options, min-mod task modification necessary    Comorbidities Affecting Occupational Performance: May have comorbidities impacting occupational performance    Modification or Assistance to Complete Evaluation  No modification of tasks or assist necessary to complete eval     OT Frequency 1x / week    OT Duration 8 weeks    OT Treatment/Interventions Self-care/ADL training;Moist Heat;DME and/or AE instruction;Splinting;Therapeutic activities;Compression bandaging;Contrast Bath;Ultrasound;Therapeutic exercise;Passive range of motion;Neuromuscular education;Iontophoresis;Cryotherapy;Electrical Stimulation;Manual Therapy;Patient/family education    Plan Update HEP depending on pain/progress; custom orthosis?    Consulted and Agree with Plan of Care Patient           Patient will benefit from skilled therapeutic intervention in order to improve the following deficits and impairments:   Body Structure /  Function / Physical Skills: ADL,Decreased knowledge of use of DME,Strength,Pain,Body mechanics,UE functional use,ROM,IADL,Sensation,Edema       Visit Diagnosis: Pain in right elbow  Pain in right forearm  Pain in right arm  Other symptoms and signs involving the nervous system    Problem List Patient Active Problem List   Diagnosis Date Noted  . Fatigue 11/09/2018  . Iron deficiency anemia 11/09/2018  . MTHFR (methylene THF reductase) deficiency and homocystinuria (HCC) 07/02/2018  . Heavy periods 06/14/2018  . Right calf pain 10/29/2017  . Eosinophilic esophagitis 08/05/2017  . Family history of colon cancer 06/08/2017  . Upper respiratory infection 06/08/2017  . Heart palpitations 07/29/2016  . Well adult exam 05/26/2016  . POTS (postural orthostatic tachycardia syndrome) 05/26/2016  . Grief 05/26/2016  . Anti-cardiolipin antibody syndrome (HCC) 05/26/2016     Rosie Fate, OTR/L, MSOT 11/28/2020, 6:00 PM  [facility] [address] [place], [place], [address] Phone: [phone]   Fax:  [phone]  Name: TIJANA WALDER MRN: [id number] Date of Birth: 06/17/1976

## 2020-12-04 ENCOUNTER — Ambulatory Visit: Payer: 59 | Admitting: Occupational Therapy

## 2020-12-04 ENCOUNTER — Other Ambulatory Visit: Payer: Self-pay

## 2020-12-04 DIAGNOSIS — M25521 Pain in right elbow: Secondary | ICD-10-CM

## 2020-12-04 DIAGNOSIS — M79631 Pain in right forearm: Secondary | ICD-10-CM

## 2020-12-04 DIAGNOSIS — M79601 Pain in right arm: Secondary | ICD-10-CM

## 2020-12-04 DIAGNOSIS — R29818 Other symptoms and signs involving the nervous system: Secondary | ICD-10-CM

## 2020-12-05 NOTE — Therapy (Addendum)
[address] [place], [place], [address] Phone: [phone]   Fax:  [phone]  Occupational Therapy Treatment  Patient Details  Name: Pamela Thompson MRN: [id number] Date of Birth: [birth year] Referring Provider (OT): Vickki Hearing, MD Rosanne Gutting)   Encounter Date: 12/04/2020   OT End of Session - 12/04/20 1800     Visit Number 3    Number of Visits 9    Date for OT Re-Evaluation 01/20/21    Authorization Type [employer]    Authorization Time Period VL: 60 (comb) - add'l avail w/ [place] review    OT Start Time 1623    OT Stop Time 1750    OT Time Calculation (min) 87 min    Activity Tolerance Patient tolerated treatment well    Behavior During Therapy [facility] for tasks assessed/performed            Past Medical History:  Diagnosis Date   Allergy    Clotting disorder ([place])    2n pregancy intrauterinal blood clot   Eosinophilic esophagitis 01/07/349   GERD (gastroesophageal reflux disease)    Hx of blood clots    uterine   Maternal APL (antiphospholipid antibody syndrome) (HCC)    POTS (postural orthostatic tachycardia syndrome)     Past Surgical History:  Procedure Laterality Date   COLONOSCOPY  2019   Multiple due to family history of colon cancer so far negative   ESOPHAGOGASTRODUODENOSCOPY     2013 - [place]   LASIK Bilateral    MOHS SURGERY     forhead   UPPER GASTROINTESTINAL ENDOSCOPY     Multiple, eosinophilic esophagitis diagnosed 2019    There were no vitals filed for this visit.   Subjective Assessment - 12/04/20 1800     Subjective  "I really thought I was going to be able to say it was feeling better." Pt also states that she feels like the pain has moved more medially over the past week compared to laterally.    Pertinent History POTS; anti-cardiolipin antibody syndrome (blood clotting disorder, per pt report)    Repetition Increases Symptoms    Currently in Pain? No/denies    Continues to report pain with specific movements            OT Treatments/Exercises (OP) - 12/04/20 1800       Splinting   Splinting Fabricated and fitted R volar-based wrist immobilization orthosis using 1/8" perforated material. Pt positioned in approx 15 degrees of wrist extension w/ thumb and digits free; pt able to oppose thumb to each finger. Pt instructed to wear orthosis as frequently as tolerated, educated on further wear and care of orthosis, and cautioned to closely monitor skin for pressure areas; pt verbalized understanding and handout was provided to pt at conclusion of session.             OT Short Term Goals - 11/28/20 1800       OT SHORT TERM GOAL #1   Title Pt will independently identify movements to avoid (extension/supination of wrist/forearm) to assist w/ decreasing symptoms    Baseline Decreased knowledge of precautions    Time 2    Period Weeks    Status On-going    Target Date 12/05/20      OT SHORT TERM GOAL #2   Title Pt will be independent with initial HEP including nerve gliding and gentle massage    Baseline No HEP at this time    Time  3    Period Weeks    Status On-going    Target Date 12/12/20      OT SHORT TERM GOAL #3   Title Pt will be independent with wear and care of orthosis prn    Baseline Prefabricated orthosis; may require custom?    Time 4    Period Weeks    Status On-going    Target Date 12/19/20             OT Long Term Goals - 11/28/20 1800       OT LONG TERM GOAL #1   Title Pt will be independent with full HEP designed to alleviate symptoms and increase functional use of RUE by d/c    Baseline No HEP at this time    Time 8    Period Weeks    Status On-going      OT LONG TERM GOAL #2   Title Pt will improve participation in home maintenance tasks as evidenced by increasing grip strength by at least 9#    Baseline R hand, dominant (36 lbs); L hand (67 lbs)    Time 8    Period Weeks    Status On-going      OT  LONG TERM GOAL #3   Title Per self-report, pt will be able to participate in work-related tasks, incorporating compensatory strategies prn, w/ pain less than 3/10 consistently    Baseline Increased pain w/ work-related tasks    Time 8    Period Weeks    Status On-going      OT LONG TERM GOAL #4   Title Pt will increase strength of R (dominant) elbow flexion/extension to at least 4/5 to improve participation in home maintenance activities    Baseline R elbow flex/ext 4-/5    Time 8    Period Weeks    Status On-going             Plan - 12/04/20 1800 OT Assessment and Plan Clinical Impression Statement: Pt arrived to OT session today for fabrication of custom R wrist-immobilization orthosis to prevent irritating movement and help alleviate symptoms. Due to pt's report of increased wrist movement allowance w/ previously administered pre-fabricated splint, OT reviewed potential benefit of custom orthosis in comparision and pt was agreeable. Pt instructed to wear orthosis prn, working to build-up tolerance and alternate with wear of pre-fabricated orthosis. Education provided on purpose of orthosis, wear and care, as well as potential signs and symptoms of irritation or inadequate fit to be aware of w/ handout administered to pt. Pt encouraged to call back/return w/ any additional questions or concerns. OT spent 65 min in direct treatment w/ remaining time comprising of periods of resting/waiting for materials to be ready. Due to pt's follow-up appt w/ MD being scheduled for 12/21/20, OT discussed placing pt on hold during immobilization period to allow for rest and return to OT at at time; pt was receptive. OT Occupational Profile and History: Problem Focused Assessment - Including review of records relating to presenting problem Occupational performance deficits (Please refer to evaluation for details):: ADL's,IADL's,Rest and Sleep,Work,Leisure Pt will benefit from skilled therapeutic  intervention in order to improve on the following performance deficits: Body Structure / Function / Physical Skills Body Structure / Function / Physical Skills: ADL,Decreased knowledge of use of DME,Strength,Pain,Body mechanics,UE functional use,ROM,IADL,Sensation,Edema Rehab Potential: Good Clinical Decision Making: Several treatment options, min-mod task modification necessary Comorbidities Affecting Occupational Performance:: May have comorbidities impacting occupational performance Modification or Assistance to  Complete Evaluation : No modification of tasks or assist necessary to complete eval OT Frequency: 1x / week OT Duration: 8 weeks OT Treatment/Interventions: Self-care/ADL training,Moist Heat,DME and/or AE instruction,Splinting,Therapeutic activities,Compression bandaging,Contrast Bath,Ultrasound,Therapeutic exercise,Passive range of motion,Neuromuscular education,Iontophoresis,Cryotherapy,Electrical Stimulation,Manual Therapy,Patient/family education Plan: Update HEP depending on pain/progress Consulted and Agree with Plan of Care: Patient   Patient will benefit from skilled therapeutic intervention in order to improve the following deficits and impairments:   Body Structure / Function / Physical Skills: ADL,Decreased knowledge of use of DME,Strength,Pain,Body mechanics,UE functional use,ROM,IADL,Sensation,Edema   Visit Diagnosis: Pain in right elbow  Pain in right forearm  Pain in right arm  Other symptoms and signs involving the nervous system   Problem List Patient Active Problem List   Diagnosis Date Noted   Fatigue 11/09/2018   Iron deficiency anemia 11/09/2018   MTHFR (methylene THF reductase) deficiency and homocystinuria ([place]) 07/02/2018   Heavy periods 06/14/2018   Right calf pain 50/35/4656   Eosinophilic esophagitis 81/27/5170   Family history of colon cancer 06/08/2017   Upper respiratory infection 06/08/2017   Heart palpitations 07/29/2016   Well adult  exam 05/26/2016   POTS (postural orthostatic tachycardia syndrome) 05/26/2016   Grief 05/26/2016   Anti-cardiolipin antibody syndrome ([place]) 05/26/2016    Kathrine Cords, OTR/L, [place] 12/04/2020, 6:00 PM  OCCUPATIONAL THERAPY DISCHARGE SUMMARY  Visits from Start of Care: 3  Current functional level related to goals / functional outcomes: Unable to comment/assess as pt called to cancel remaining visit on 01/08/21 and did not return to occupational therapy. Pt had not achieved STGs or LTGs at time of last visit.    Remaining deficits: As of last visit on 12/04/20 - decreased strength and functional use of RUE; pain   Education / Equipment: Condition-specific education regarding potentially aggravating movements/positioning to avoid; nerve gliding; orthotics training and management; pain management strategies  Pt goals were not met as they were unable to be assessed due to unexpected d/c. Patient is being discharged due to not returning since the last visit.  Kathrine Cords, OTR/L, MSOT 06/25/2021, 10:55 AM   [address] [place], [place], [address] Phone: [phone]   Fax:  [phone]  Name: JARED CAHN MRN: [id number] Date of Birth: April 02, 1976

## 2020-12-05 NOTE — Progress Notes (Signed)
[facility]  [phone] [phone]     ID: AITHANA GUIN DOB: [birth year]  MR#: [id number]  JYN#:[id number]  Patient Care Team: Tresa Garter, MD as PCP - General (Internal Medicine) Olivia Mackie, MD as Consulting Physician (Obstetrics and Gynecology) Miaisabella Bacorn, Valentino Hue, MD as Consulting Physician (Oncology) Iva Boop, MD as Consulting Physician (Gastroenterology) OTHER MD:   CHIEF COMPLAINT: Pregnancy associated anti-cardiolipin antibody syndrome; iron deficiency anemia  CURRENT TREATMENT: Observation   INTERVAL HISTORY: Pamela Thompson returns today for evaluation of her mild iron deficiency anemia. She was previously evaluated in the hematology clinic on 07/02/2018 for anti-cardiolipin antibody syndrome.  At that time evaluation was negative for the anticardiolipin syndrome  She was also followed for iron deficiency.  Her most recent values on December 2021 showed a ferritin of 5 and an iron saturation of 18.  At that time she was advised to increase intake of iron rich foods and she has tried to comply.  Those labs are being repeated today.  REVIEW OF SYSTEMS: Pamela Thompson continues to menstruate regularly and heavily.  She does feel more tired than she thinks she should.  On the other hand she has a very full plate: Children, husband, work, pets.  She notices that now she feels tired in the morning whereas before despite all the activities she felt refreshed in the morning.  She also is currently not using her Peloton or doing any running.  In short there has been a little bit of a change in her functional status.  She does have insomnia issues partly due to a new puppy who wakes up but 3:15 in the morning every night but also because she tends to be a light sleeper, which is not a new problem.  Aside from these issues a detailed review of systems today was stable   COVID 19 VACCINATION STATUS: Fully vaccinated, no booster as of May 2022   HISTORY  OF CURRENT ILLNESS: [name] was referred by Dr. Sonda Primes for evaluation and treatment of anti-cardiolipin antibody syndrome.  I saw Darl Pikes about 14 years ago during her first pregnancy when she was found to have a lupus anti-coagulant.  We discussed anticoagulation but she did not take Asprin or Lovenox during that pregnancy. Instead she and her GYN decided to deliver her baby two weeks early.  There was no postoperative clot or other complication and her daughter Dorene Grebe is currently 17-1/2 years old and very healthy.  Darl Pikes had a second pregnancy while living in [place]. This was complicated by an early intrauterine hematoma (at 8 weeks). She was diagnosed with methylenetetrahydrofolate reductase deficiency (we do not ave that data). She was treated with high doses of folic acid. Her second child was also delivered two weeks early.  There were no other pregnancy complications.  Kara Mead, currently 45 years old, did develop or was found to have a right carotid "kink" at age 42 and since then has been noted to be small for age, with an extra right foot bone, and with some dental anomalies including a fused tooth.  More recently Angelena Sole has returned to this area and established herself with Dr. Posey Rea.  She has been complaining of heavy clotting with her periods which however are regular, lasting approximately 7 to 10 days, of which the first 2 days are heavy.  She is being referred for further evaluation of her prior history of possible lupus anticoagulant syndrome  The patient's subsequent history is as detailed below.  PAST MEDICAL HISTORY: Past Medical History:  Diagnosis Date  . Allergy   . Clotting disorder (HCC)    2n pregancy intrauterinal blood clot  . Eosinophilic esophagitis 08/05/2017  . GERD (gastroesophageal reflux disease)   . Hx of blood clots    uterine  . Maternal APL (antiphospholipid antibody syndrome) (HCC)   . POTS (postural orthostatic tachycardia syndrome)      PAST SURGICAL HISTORY: Past Surgical History:  Procedure Laterality Date  . COLONOSCOPY  2019   Multiple due to family history of colon cancer so far negative  . ESOPHAGOGASTRODUODENOSCOPY     2013 - [place]  . LASIK Bilateral   . MOHS SURGERY     [place]  . UPPER GASTROINTESTINAL ENDOSCOPY     Multiple, eosinophilic esophagitis diagnosed 2019    FAMILY HISTORY Family History  Problem Relation Age of Onset  . Colon cancer Mother 29  . Rectal cancer Mother   . Colon cancer Maternal Grandmother 69       died from mets close to time of dx  . Liver disease Maternal Grandmother   . Hypertension Father   . Cancer Father        Esophageal cancer  . Prostate cancer Paternal Grandfather   . Esophageal cancer Neg Hx   . Stomach cancer Neg Hx     GYNECOLOGIC HISTORY:  No LMP recorded. 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 Her husband, Crisoforo Oxford, is a Surveyor, quantity. Together, they have two children, Dorene Grebe (47 y/o) and Kara Mead (14), who are both in school and live at home. Lanah attends [employer]   ADVANCED DIRECTIVES: In the absence of any documentation to the contrary, the patient's spouse is their HCPOA.    HEALTH MAINTENANCE: Social History   Tobacco Use  . Smoking status: Never Smoker  . Smokeless tobacco: Never Used  Vaping Use  . Vaping Use: Never used  Substance Use Topics  . Alcohol use: Yes    Comment: 2 per week  . Drug use: No    Colonoscopy: 07/31/2017  PAP: up to date   Bone density: no   Allergies  Allergen Reactions  . Minocycline Hives    Can take a Zpac  . Penicillins Other (See Comments)    Pt does not know reaction    Current Outpatient Medications  Medication Sig Dispense  Refill  . Ascorbic Acid (VITAMIN C PO) Take 1 tablet by mouth daily.    Marland Kitchen aspirin (BAYER ASPIRIN) 325 MG tablet Take 1 tablet for prolonged trips 100 tablet 3  . b complex vitamins capsule Take 1 capsule by mouth every other day.    . Cholecalciferol (VITAMIN D3) 2000 units TABS Take 1 tablet by mouth daily.    . metoprolol succinate (TOPROL-XL) 25 MG 24 hr tablet Take 1 tablet (25 mg total) by mouth daily. 90 tablet 3  . pantoprazole (PROTONIX) 40 MG tablet Take 1 tablet (40 mg total) by mouth daily before breakfast. 90 tablet 3  . predniSONE (DELTASONE) 20 MG tablet Take 20 mg by mouth daily with breakfast.     No current facility-administered medications for this visit.    OBJECTIVE: White woman who appears younger than stated age  Vitals:   12/06/20 1105  BP: 127/75  Pulse: 83  Resp: 18  Temp: 97.9 F (36.6 C)  SpO2: 100%     Body mass index is 30.82 kg/m.   Wt Readings from Last 3 Encounters:  12/06/20 174 lb (78.9 kg)  06/28/20 173 lb 12.8 oz (78.8 kg)  06/30/19 163 lb (73.9 kg)      ECOG FS:0 - Asymptomatic  Sclerae unicteric, EOMs intact Wearing a mask No cervical or supraclavicular adenopathy Lungs no rales or rhonchi Heart regular rate and rhythm Abd soft, nontender, positive bowel sounds MSK no focal spinal tenderness, no upper extremity lymphedema Neuro: nonfocal, well oriented, appropriate affect Breasts: Deferred   LAB RESULTS:  CMP     Component Value Date/Time   NA 135 06/28/2020 1450   NA 143 05/09/2016 0000   K 3.7 06/28/2020 1450   CL 102 06/28/2020 1450   CO2 26 06/28/2020 1450   GLUCOSE 83 06/28/2020 1450   BUN 13 06/28/2020 1450   BUN 14 05/09/2016 0000   CREATININE 0.89 06/28/2020 1450   CALCIUM 9.6 06/28/2020 1450   PROT 7.7 06/28/2020 1450   ALBUMIN 4.5 06/28/2020 1450   AST 20 06/28/2020 1450   ALT 12 06/28/2020 1450   ALKPHOS 36 (L) 06/28/2020 1450   BILITOT 0.6 06/28/2020 1450   GFRNONAA >60 10/06/2018 1821   GFRAA >60  10/06/2018 1821    Lab Results  Component Value Date   WBC 11.2 (H) 12/06/2020   NEUTROABS 7.9 (H) 12/06/2020   HGB 10.4 (L) 12/06/2020   HCT 33.8 (L) 12/06/2020   MCV 85.6 12/06/2020   PLT 303 12/06/2020   No results found for: LABCA2  No components found for: ZOXWRU045  No results for input(s): INR in the last 168 hours.  No results found for: LABCA2  No results found for: WUJ811  No results found for: BJY782  No results found for: NFA213  No results found for: CA2729  No components found for: HGQUANT  No results found for: CEA1 / No results found for: CEA1   No results found for: AFPTUMOR  No results found for: CHROMOGRNA  No results found for: TOTALPROTELP, ALBUMINELP, A1GS, A2GS, BETS, BETA2SER, GAMS, MSPIKE, SPEI (this displays SPEP labs)  No results found for: KPAFRELGTCHN, LAMBDASER, KAPLAMBRATIO (kappa/lambda light chains)  No results found for: HGBA, HGBA2QUANT, HGBFQUANT, HGBSQUAN (Hemoglobinopathy evaluation)   No results found for: LDH  Lab Results  Component Value Date   IRON 86 06/28/2020   TIBC 472 (H) 06/28/2020   IRONPCTSAT 18 06/28/2020   (Iron and TIBC)  Lab Results  Component Value Date   FERRITIN 5 (L) 06/28/2020    Urinalysis    Component Value Date/Time   COLORURINE YELLOW 06/28/2020 1450   APPEARANCEUR CLEAR 06/28/2020 1450   LABSPEC 1.015 06/28/2020 1450   PHURINE 5.5 06/28/2020 1450   GLUCOSEU NEGATIVE 06/28/2020 1450   HGBUR NEGATIVE 06/28/2020 1450   BILIRUBINUR NEGATIVE 06/28/2020 1450   KETONESUR NEGATIVE 06/28/2020 1450   UROBILINOGEN 0.2 06/28/2020 1450   NITRITE NEGATIVE 06/28/2020 1450   LEUKOCYTESUR NEGATIVE 06/28/2020 1450    STUDIES: No results found.    ELIGIBLE FOR AVAILABLE RESEARCH PROTOCOL: no  ASSESSMENT: 45 y.o. [place]ura Spice, [place] woman with a history of a lupus anticoagulant detected 2005, but no history of DVT or PE or any history of clotting problems despite at 2 vaginal deliveries and other  minor surgeries  (1) lupus anticoagulant screen negative on 07/02/2018, as were beta-2 glycoprotein and anticardiolipin  antibodies  (2) history of MTHFR deficiency (data not available for review)  (3) iron deficiency anemia likely secondary to menorrhagia  (a) not resolved with dietary changes  (b) iron infusions scheduled to begin 12/24/2020   PLAN: Darl Pikes is moderately anemic and I think she is having symptoms related to this of course she is very active with a full-time job, going home to [place], 2 teenage children, 3 dogs, and so 1.  Nevertheless as she notes as she used to be able to exercise on top of all that and now she is not doing it and she used to wake up feeling refreshed in the morning and she no longer does.  Iron deficiency not only affects red cells and hemoglobin but also many enzymes in the body and I think if she had good iron replenishment as she would feel generally better although I do not think she will sleep any better until her dog gets a little older and get strain to sleep through the night  We discussed iron infusions and she has a good understanding of the possible toxicities side effects and complications of these agents.  She will have her first dose 12/24/2020.  She will let me know if there are any issues related to that  Aside from the infusions I am making no further routine appointments for her here.  She will continue to be followed through Dr. Posey Rea and he will refer her back to [place] on an as-needed basis in the future  Total encounter time 25 minutes.*   Breigh Annett, Valentino Hue, MD  12/06/20 11:24 AM Medical Oncology and Hematology [facility] [address] [place], [place] [address] Tel. [phone]    Fax. [phone]    I, Mickie Bail, am acting as scribe for Dr. Valentino Hue. Dejean Tribby.  I, Ruthann Cancer MD, have reviewed the above documentation for accuracy and completeness, and I agree with the above.

## 2020-12-06 ENCOUNTER — Inpatient Hospital Stay: Payer: 59

## 2020-12-06 ENCOUNTER — Telehealth: Payer: Self-pay | Admitting: Oncology

## 2020-12-06 ENCOUNTER — Inpatient Hospital Stay: Payer: 59 | Attending: Oncology | Admitting: Oncology

## 2020-12-06 ENCOUNTER — Other Ambulatory Visit: Payer: Self-pay

## 2020-12-06 ENCOUNTER — Other Ambulatory Visit: Payer: Self-pay | Admitting: *Deleted

## 2020-12-06 VITALS — BP 127/75 | HR 83 | Temp 97.9°F | Resp 18 | Ht 63.0 in | Wt 174.0 lb

## 2020-12-06 DIAGNOSIS — D500 Iron deficiency anemia secondary to blood loss (chronic): Secondary | ICD-10-CM

## 2020-12-06 DIAGNOSIS — I498 Other specified cardiac arrhythmias: Secondary | ICD-10-CM | POA: Diagnosis not present

## 2020-12-06 DIAGNOSIS — D6861 Antiphospholipid syndrome: Secondary | ICD-10-CM

## 2020-12-06 DIAGNOSIS — E7212 Methylenetetrahydrofolate reductase deficiency: Secondary | ICD-10-CM | POA: Diagnosis not present

## 2020-12-06 DIAGNOSIS — E7211 Homocystinuria: Secondary | ICD-10-CM

## 2020-12-06 DIAGNOSIS — N920 Excessive and frequent menstruation with regular cycle: Secondary | ICD-10-CM

## 2020-12-06 DIAGNOSIS — D509 Iron deficiency anemia, unspecified: Secondary | ICD-10-CM | POA: Insufficient documentation

## 2020-12-06 DIAGNOSIS — G90A Postural orthostatic tachycardia syndrome (POTS): Secondary | ICD-10-CM

## 2020-12-06 LAB — CMP (CANCER CENTER ONLY)
ALT: 12 U/L (ref 0–44)
AST: 15 U/L (ref 15–41)
Albumin: 4 g/dL (ref 3.5–5.0)
Alkaline Phosphatase: 45 U/L (ref 38–126)
Anion gap: 8 (ref 5–15)
BUN: 16 mg/dL (ref 6–20)
CO2: 29 mmol/L (ref 22–32)
Calcium: 9.8 mg/dL (ref 8.9–10.3)
Chloride: 101 mmol/L (ref 98–111)
Creatinine: 0.82 mg/dL (ref 0.44–1.00)
GFR, Estimated: >60 mL/min (ref >60)
Glucose, Bld: 85 mg/dL (ref 70–99)
Potassium: 3.6 mmol/L (ref 3.5–5.1)
Sodium: 138 mmol/L (ref 135–145)
Total Bilirubin: 0.5 mg/dL (ref 0.3–1.2)
Total Protein: 7.5 g/dL (ref 6.5–8.1)

## 2020-12-06 LAB — CBC WITH DIFFERENTIAL (CANCER CENTER ONLY)
Abs Immature Granulocytes: 0.04 10*3/uL (ref 0.00–0.07)
Basophils Absolute: 0 10*3/uL (ref 0.0–0.1)
Basophils Relative: 0 %
Eosinophils Absolute: 0.1 10*3/uL (ref 0.0–0.5)
Eosinophils Relative: 1 %
HCT: 33.8 % — ABNORMAL LOW (ref 36.0–46.0)
Hemoglobin: 10.4 g/dL — ABNORMAL LOW (ref 12.0–15.0)
Immature Granulocytes: 0 %
Lymphocytes Relative: 22 %
Lymphs Abs: 2.4 10*3/uL (ref 0.7–4.0)
MCH: 26.3 pg (ref 26.0–34.0)
MCHC: 30.8 g/dL (ref 30.0–36.0)
MCV: 85.6 fL (ref 80.0–100.0)
Monocytes Absolute: 0.7 10*3/uL (ref 0.1–1.0)
Monocytes Relative: 6 %
Neutro Abs: 7.9 10*3/uL — ABNORMAL HIGH (ref 1.7–7.7)
Neutrophils Relative %: 71 %
Platelet Count: 303 10*3/uL (ref 150–400)
RBC: 3.95 MIL/uL (ref 3.87–5.11)
RDW: 14.3 % (ref 11.5–15.5)
WBC Count: 11.2 10*3/uL — ABNORMAL HIGH (ref 4.0–10.5)
nRBC: 0 % (ref 0.0–0.2)

## 2020-12-06 LAB — FERRITIN: Ferritin: 5 ng/mL — ABNORMAL LOW (ref 11–307)

## 2020-12-06 LAB — IRON AND TIBC
Iron: 53 ug/dL (ref 41–142)
Saturation Ratios: 12 % — ABNORMAL LOW (ref 21–57)
TIBC: 464 ug/dL — ABNORMAL HIGH (ref 236–444)
UIBC: 410 ug/dL — ABNORMAL HIGH (ref 120–384)

## 2020-12-06 NOTE — Telephone Encounter (Signed)
Scheduled appointment per 05/26 los. Patient is aware. 

## 2020-12-16 IMAGING — DX PORTABLE CHEST - 1 VIEW
2 series · 2 of 2 positions shown · non-contrast
Comparison: None.

CLINICAL DATA: Cough with chest burning.

EXAM:
PORTABLE CHEST 1 VIEW

[chest ap (1 of 2)]
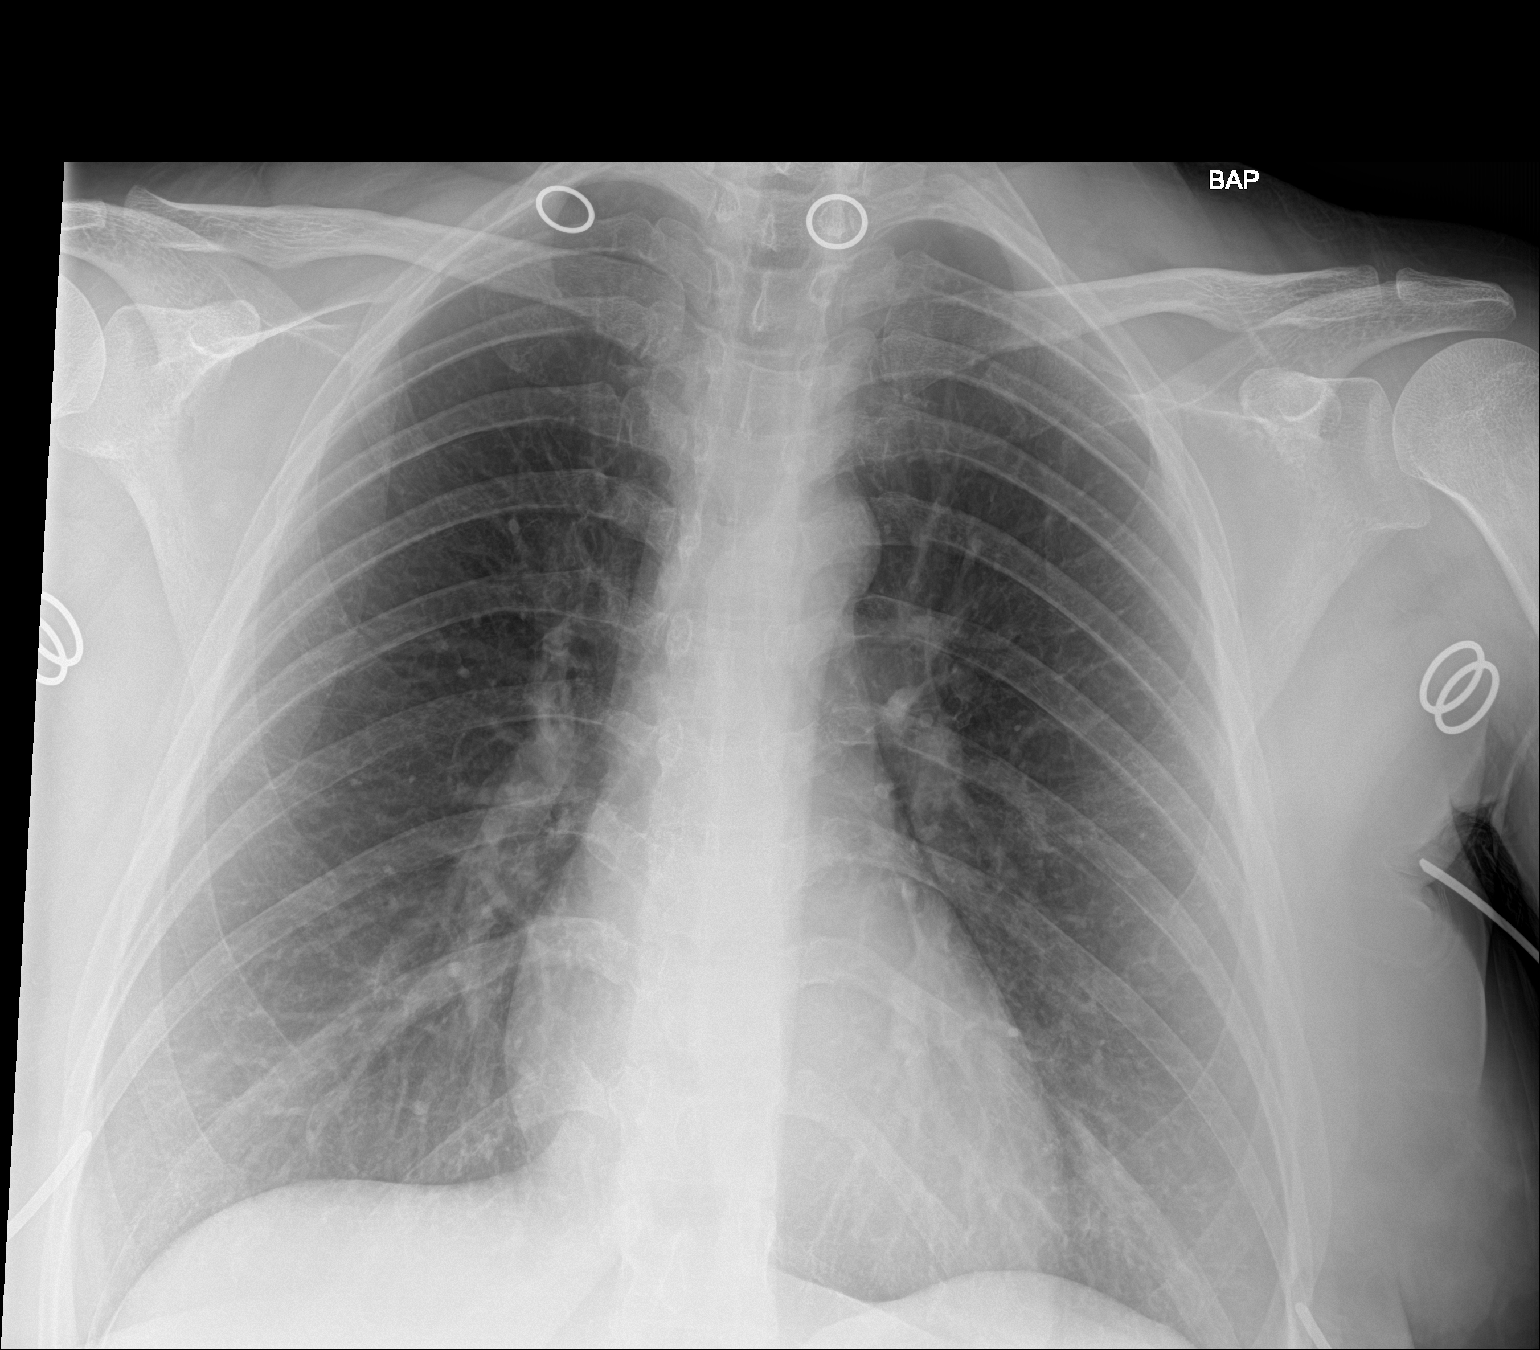

[chest ap (2 of 2)]
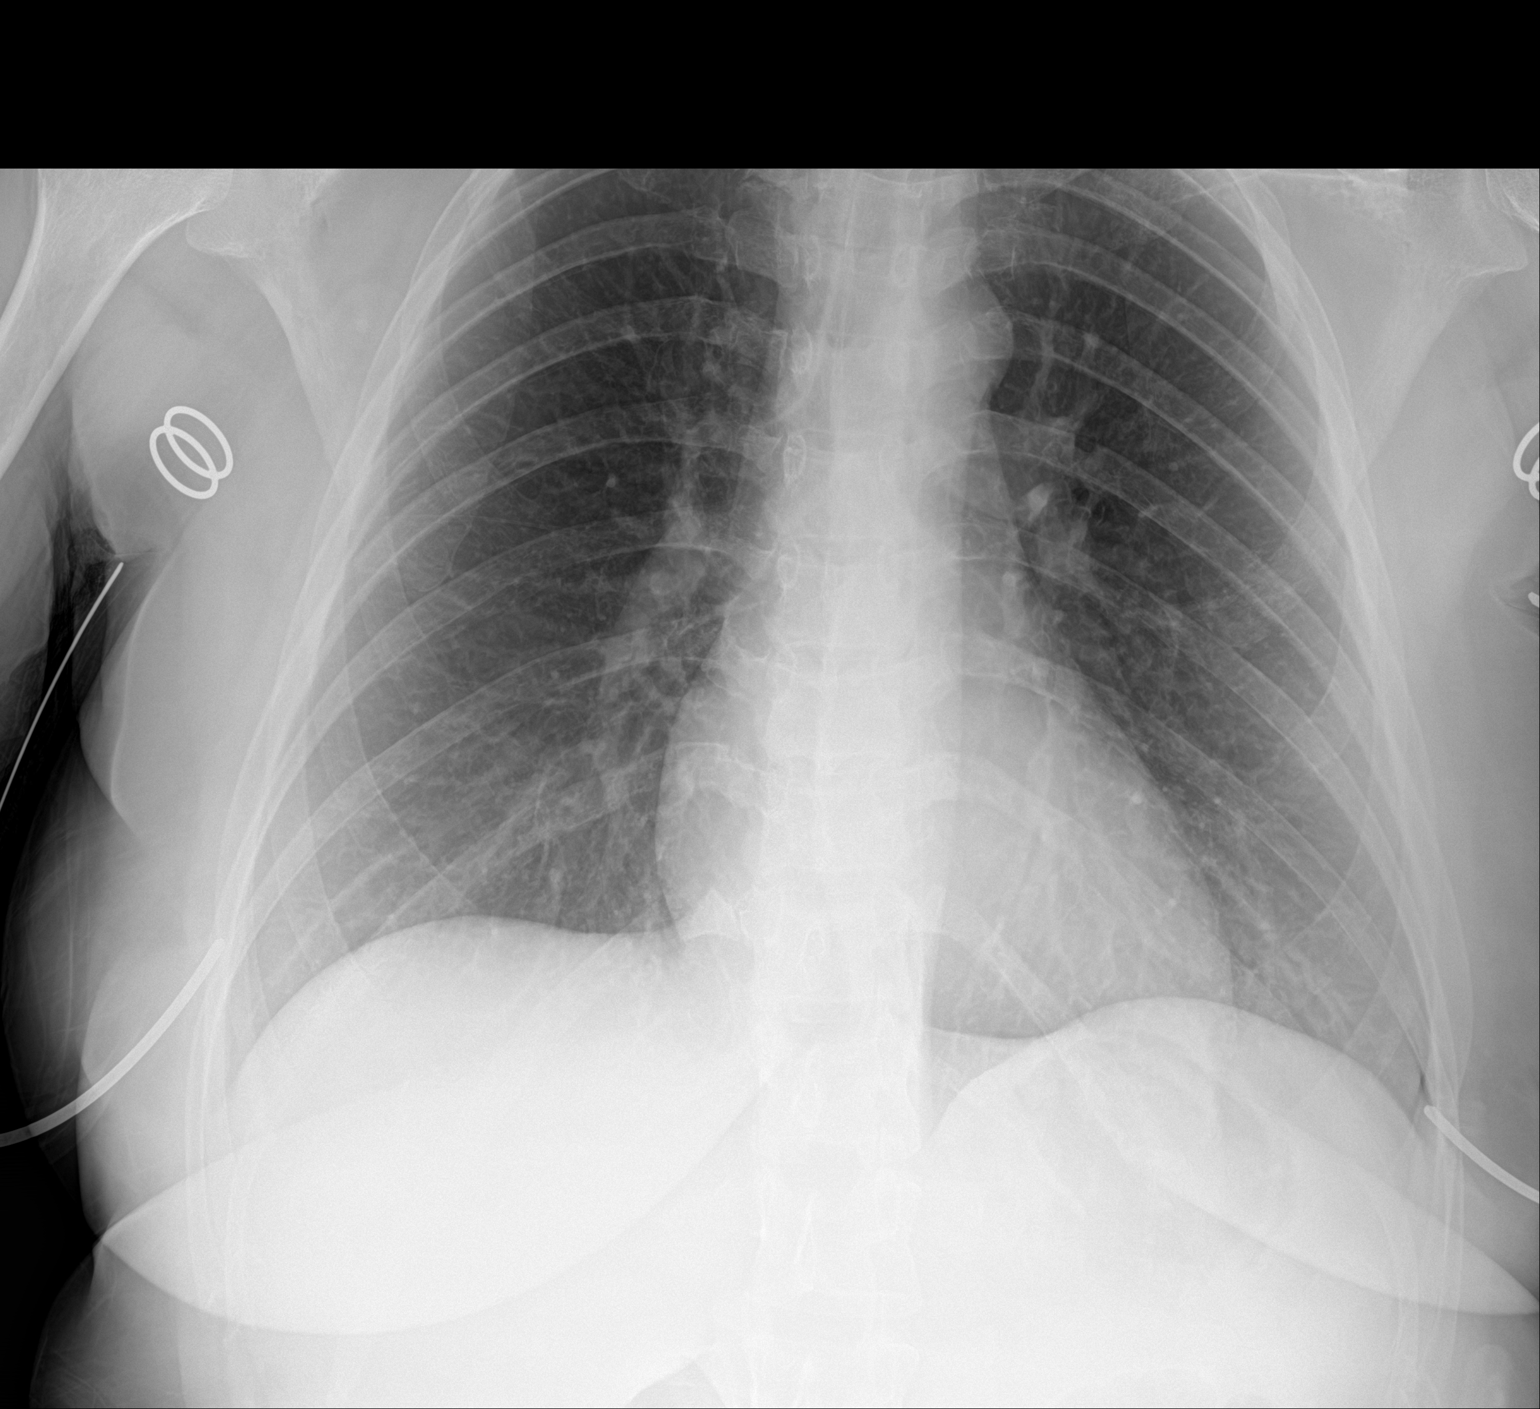

[2 of 2 positions shown; findings below may reference images not displayed]

FINDINGS: 5343 hours. Two views obtained. The heart size and mediastinal
contours are normal. The lungs are clear. There is no pleural
effusion or pneumothorax. No acute osseous findings are identified.
IMPRESSION: No active cardiopulmonary process.

## 2020-12-18 ENCOUNTER — Other Ambulatory Visit: Payer: Self-pay | Admitting: Hematology

## 2020-12-24 ENCOUNTER — Telehealth: Payer: Self-pay | Admitting: Oncology

## 2020-12-24 NOTE — Telephone Encounter (Signed)
Rescheduled apt per 06/13 secure chat, left patient will detailed message.

## 2020-12-25 ENCOUNTER — Inpatient Hospital Stay: Payer: 59 | Attending: Oncology

## 2020-12-25 ENCOUNTER — Other Ambulatory Visit: Payer: Self-pay

## 2020-12-25 VITALS — BP 120/75 | HR 75 | Temp 98.7°F | Resp 18

## 2020-12-25 DIAGNOSIS — D509 Iron deficiency anemia, unspecified: Secondary | ICD-10-CM | POA: Diagnosis present

## 2020-12-25 DIAGNOSIS — D500 Iron deficiency anemia secondary to blood loss (chronic): Secondary | ICD-10-CM

## 2020-12-25 MED ORDER — SODIUM CHLORIDE 0.9 % IV SOLN
400.0000 mg | Freq: Once | INTRAVENOUS | Status: AC
  Administered 2020-12-25: 400 mg via INTRAVENOUS
  Filled 2020-12-25: qty 20

## 2020-12-25 MED ORDER — SODIUM CHLORIDE 0.9 % IV SOLN
Freq: Once | INTRAVENOUS | Status: AC
  Filled 2020-12-25: qty 250

## 2020-12-25 MED ORDER — LORATADINE 10 MG PO TABS
10.0000 mg | ORAL_TABLET | Freq: Once | ORAL | Status: AC
  Administered 2020-12-25: 10 mg via ORAL

## 2020-12-25 MED ORDER — LORATADINE 10 MG PO TABS
ORAL_TABLET | ORAL | Status: AC
  Filled 2020-12-25: qty 1

## 2020-12-25 NOTE — Patient Instructions (Signed)
[facility]  Discharge Instructions: Thank you for choosing [facility] to provide your oncology and hematology care.   If you have a lab appointment with the Cancer Center, please go directly to the Cancer Center and check in at the registration area.   Wear comfortable clothing and clothing appropriate for easy access to any Portacath or PICC line.   We strive to give you quality time with your provider. You may need to reschedule your appointment if you arrive late (15 or more minutes).  Arriving late affects you and other patients whose appointments are after yours.  Also, if you miss three or more appointments without notifying the office, you may be dismissed from the clinic at the provider's discretion.      For prescription refill requests, have your pharmacy contact our office and allow 72 hours for refills to be completed.    Today you received the following medication - Venofer      To help prevent nausea and vomiting after your treatment, we encourage you to take your nausea medication as directed.  BELOW ARE SYMPTOMS THAT SHOULD BE REPORTED IMMEDIATELY: *FEVER GREATER THAN 100.4 F (38 C) OR HIGHER *CHILLS OR SWEATING *NAUSEA AND VOMITING THAT IS NOT CONTROLLED WITH YOUR NAUSEA MEDICATION *UNUSUAL SHORTNESS OF BREATH *UNUSUAL BRUISING OR BLEEDING *URINARY PROBLEMS (pain or burning when urinating, or frequent urination) *BOWEL PROBLEMS (unusual diarrhea, constipation, pain near the anus) TENDERNESS IN MOUTH AND THROAT WITH OR WITHOUT PRESENCE OF ULCERS (sore throat, sores in mouth, or a toothache) UNUSUAL RASH, SWELLING OR PAIN  UNUSUAL VAGINAL DISCHARGE OR ITCHING   Items with * indicate a potential emergency and should be followed up as soon as possible or go to the Emergency Department if any problems should occur.  Please show the CHEMOTHERAPY ALERT CARD or IMMUNOTHERAPY ALERT CARD at check-in to the Emergency Department and  triage nurse.  Should you have questions after your visit or need to cancel or reschedule your appointment, please contact [facility]  Dept: [phone]  and follow the prompts.  Office hours are 8:00 a.m. to 4:30 p.m. Monday - Friday. Please note that voicemails left after 4:00 p.m. may not be returned until the following business day.  We are closed weekends and major holidays. You have access to a nurse at all times for urgent questions. Please call the main number to the clinic Dept: [phone] and follow the prompts.   For any non-urgent questions, you may also contact your provider using MyChart. We now offer e-Visits for anyone 18 and older to request care online for non-urgent symptoms. For details visit [email or web address].   Also download the MyChart app! Go to the app store, search "MyChart", open the app, select [facility], and log in with your MyChart username and password.  Due to Covid, a mask is required upon entering the hospital/clinic. If you do not have a mask, one will be given to you upon arrival. For doctor visits, patients may have 1 support person aged 18 or older with them. For treatment visits, patients cannot have anyone with them due to current Covid guidelines and our immunocompromised population.   Iron Sucrose injection What is this medication? IRON SUCROSE (AHY ern SOO krohs) is an iron complex. Iron is used to make healthy red blood cells, which carry oxygen and nutrients throughout the body. This medicine is used to treat iron deficiency anemia in people with chronickidney disease. This medicine may   be used for other purposes; ask your health care provider orpharmacist if you have questions. COMMON BRAND NAME(S): Venofer What should I tell my care team before I take this medication? They need to know if you have any of these conditions: anemia not caused by low iron levels heart disease high levels of iron in the  blood kidney disease liver disease an unusual or allergic reaction to iron, other medicines, foods, dyes, or preservatives pregnant or trying to get pregnant breast-feeding How should I use this medication? This medicine is for infusion into a vein. It is given by a health careprofessional in a hospital or clinic setting. Talk to your pediatrician regarding the use of this medicine in children. While this drug may be prescribed for children as young as 2 years for selectedconditions, precautions do apply. Overdosage: If you think you have taken too much of this medicine contact apoison control center or emergency room at once. NOTE: This medicine is only for you. Do not share this medicine with others. What if I miss a dose? It is important not to miss your dose. Call your doctor or health careprofessional if you are unable to keep an appointment. What may interact with this medication? Do not take this medicine with any of the following medications: deferoxamine dimercaprol other iron products This medicine may also interact with the following medications: chloramphenicol deferasirox This list may not describe all possible interactions. Give your health care provider a list of all the medicines, herbs, non-prescription drugs, or dietary supplements you use. Also tell them if you smoke, drink alcohol, or use illegaldrugs. Some items may interact with your medicine. What should I watch for while using this medication? Visit your doctor or healthcare professional regularly. Tell your doctor or healthcare professional if your symptoms do not start to get better or if theyget worse. You may need blood work done while you are taking this medicine. You may need to follow a special diet. Talk to your doctor. Foods that contain iron include: whole grains/cereals, dried fruits, beans, or peas, leafy greenvegetables, and organ meats (liver, kidney). What side effects may I notice from receiving this  medication? Side effects that you should report to your doctor or health care professionalas soon as possible: allergic reactions like skin rash, itching or hives, swelling of the face, lips, or tongue breathing problems changes in blood pressure cough fast, irregular heartbeat feeling faint or lightheaded, falls fever or chills flushing, sweating, or hot feelings joint or muscle aches/pains seizures swelling of the ankles or feet unusually weak or tired Side effects that usually do not require medical attention (report to yourdoctor or health care professional if they continue or are bothersome): diarrhea feeling achy headache irritation at site where injected nausea, vomiting stomach upset tiredness This list may not describe all possible side effects. Call your doctor for medical advice about side effects. You may report side effects to FDA at1-800-FDA-1088. Where should I keep my medication? This drug is given in a hospital or clinic and will not be stored at home. NOTE: This sheet is a summary. It may not cover all possible information. If you have questions about this medicine, talk to your doctor, pharmacist, orhealth care provider.  2022 Elsevier/Gold Standard (2011-04-10 17:14:35)   

## 2020-12-26 ENCOUNTER — Ambulatory Visit: Payer: 59 | Admitting: Occupational Therapy

## 2020-12-31 ENCOUNTER — Encounter: Payer: Self-pay | Admitting: Oncology

## 2021-01-01 ENCOUNTER — Inpatient Hospital Stay: Payer: 59

## 2021-01-01 ENCOUNTER — Encounter: Payer: Self-pay | Admitting: Oncology

## 2021-01-01 ENCOUNTER — Other Ambulatory Visit: Payer: Self-pay

## 2021-01-01 VITALS — BP 108/52 | HR 79 | Temp 98.7°F | Resp 18 | Ht 63.0 in | Wt 173.8 lb

## 2021-01-01 DIAGNOSIS — D509 Iron deficiency anemia, unspecified: Secondary | ICD-10-CM | POA: Diagnosis not present

## 2021-01-01 DIAGNOSIS — D500 Iron deficiency anemia secondary to blood loss (chronic): Secondary | ICD-10-CM

## 2021-01-01 MED ORDER — SODIUM CHLORIDE 0.9 % IV SOLN
Freq: Once | INTRAVENOUS | Status: AC
Start: 2021-01-01 — End: 2021-01-01
  Filled 2021-01-01: qty 250

## 2021-01-01 MED ORDER — IRON SUCROSE 20 MG/ML IV SOLN
400.0000 mg | Freq: Once | INTRAVENOUS | Status: AC
  Administered 2021-01-01: 400 mg via INTRAVENOUS
  Filled 2021-01-01: qty 20

## 2021-01-01 MED ORDER — LORATADINE 10 MG PO TABS
ORAL_TABLET | ORAL | Status: AC
  Filled 2021-01-01: qty 1

## 2021-01-01 MED ORDER — LORATADINE 10 MG PO TABS
10.0000 mg | ORAL_TABLET | Freq: Once | ORAL | Status: AC
  Administered 2021-01-01: 10 mg via ORAL

## 2021-01-01 NOTE — Patient Instructions (Signed)

## 2021-01-02 DIAGNOSIS — G459 Transient cerebral ischemic attack, unspecified: Secondary | ICD-10-CM | POA: Insufficient documentation

## 2021-01-02 DIAGNOSIS — K219 Gastro-esophageal reflux disease without esophagitis: Secondary | ICD-10-CM | POA: Insufficient documentation

## 2021-01-08 ENCOUNTER — Ambulatory Visit: Payer: 59 | Admitting: Occupational Therapy

## 2021-01-09 ENCOUNTER — Telehealth: Payer: Self-pay | Admitting: *Deleted

## 2021-01-09 DIAGNOSIS — D500 Iron deficiency anemia secondary to blood loss (chronic): Secondary | ICD-10-CM

## 2021-01-09 NOTE — Telephone Encounter (Signed)
Pt completed 2 cycles of iron sucrose 400mg  ( total of 800 mg ) per MD order.  Pt needs to have labs rechecked in 3 months. CBC and ferritin.  Per communication with pt - she would like above to be done thru her primary MD.  This note will be forwarded to Dr per patient request.  Lab needed approximately mid September 2022.

## 2021-01-11 NOTE — Addendum Note (Signed)
Addended by: Tresa Garter on: 01/11/2021 09:02 AM   Modules accepted: Orders

## 2021-01-11 NOTE — Telephone Encounter (Signed)
Ok Thx 

## 2021-03-27 ENCOUNTER — Telehealth: Payer: 59 | Admitting: Emergency Medicine

## 2021-03-27 DIAGNOSIS — U071 COVID-19: Secondary | ICD-10-CM

## 2021-03-27 MED ORDER — NIRMATRELVIR/RITONAVIR (PAXLOVID)TABLET: 3.0000 | ORAL_TABLET | Freq: Two times a day (BID) | ORAL | 0 refills | Status: AC

## 2021-03-27 NOTE — Progress Notes (Signed)
Virtual Visit Consent   Pamela Thompson, you are scheduled for a virtual visit with a [facility] provider today.     Just as with appointments in the office, your consent must be obtained to participate.  Your consent will be active for this visit and any virtual visit you may have with one of our providers in the next 365 days.     If you have a MyChart account, a copy of this consent can be sent to you electronically.  All virtual visits are billed to your insurance company just like a traditional visit in the office.    As this is a virtual visit, video technology does not allow for your provider to perform a traditional examination.  This may limit your provider's ability to fully assess your condition.  If your provider identifies any concerns that need to be evaluated in person or the need to arrange testing (such as labs, EKG, etc.), we will make arrangements to do so.     Although advances in technology are sophisticated, we cannot ensure that it will always work on either your end or our end.  If the connection with a video visit is poor, the visit may have to be switched to a telephone visit.  With either a video or telephone visit, we are not always able to ensure that we have a secure connection.     I need to obtain your verbal consent now.   Are you willing to proceed with your visit today?    KYNLEY METZGER has provided verbal consent on 03/27/2021 for a virtual visit  video.   Roxy Horseman, PA-C   Date: 03/27/2021 3:00 PM   Virtual Visit via Video Note   I, Roxy Horseman, connected with  CALENE PARADISO  ([id number], 06/01/1976) on 03/27/21 at  3:00 PM EDT by a video-enabled telemedicine application and verified that I am speaking with the correct person using two identifiers.  Location: Patient: Virtual Visit Location Patient: Home Provider: Virtual Visit Location Provider: Home Office   I discussed the limitations of evaluation and management by telemedicine and the  availability of in person appointments. The patient expressed understanding and agreed to proceed.    History of Present Illness: Pamela Thompson is a 45 y.o. who identifies as a female who was assigned female at birth, and is being seen today for COVID symptoms.  States that the symptoms started on Monday (2 days ago).  Had diarrhea, headache, and dry heaving.  States that headache has been worsening.  Reports fever 102.  Has been taking Tylenol and Ibuprofen.   Covid test positive as of today.  HPI: HPI  Problems:  Patient Active Problem List   Diagnosis Date Noted   Fatigue 11/09/2018   Iron deficiency anemia 11/09/2018   MTHFR (methylene THF reductase) deficiency and homocystinuria (HCC) 07/02/2018   Heavy periods 06/14/2018   Right calf pain 10/29/2017   Eosinophilic esophagitis 08/05/2017   Family history of colon cancer 06/08/2017   Upper respiratory infection 06/08/2017   Heart palpitations 07/29/2016   Well adult exam 05/26/2016   POTS (postural orthostatic tachycardia syndrome) 05/26/2016   Grief 05/26/2016   Anti-cardiolipin antibody syndrome (HCC) 05/26/2016    Allergies:  Allergies  Allergen Reactions   Minocycline Hives    Can take a Zpac   Penicillins Other (See Comments)    Pt does not know reaction   Medications:  Current Outpatient Medications:    Ascorbic Acid (VITAMIN C PO),  Take 1 tablet by mouth daily., Disp: , Rfl:    aspirin (BAYER ASPIRIN) 325 MG tablet, Take 1 tablet for prolonged trips (Patient not taking: Reported on 12/25/2020), Disp: 100 tablet, Rfl: 3   b complex vitamins capsule, Take 1 capsule by mouth every other day., Disp: , Rfl:    Cholecalciferol (VITAMIN D3) 2000 units TABS, Take 1 tablet by mouth daily., Disp: , Rfl:    metoprolol succinate (TOPROL-XL) 25 MG 24 hr tablet, Take 1 tablet (25 mg total) by mouth daily., Disp: 90 tablet, Rfl: 3   pantoprazole (PROTONIX) 40 MG tablet, Take 1 tablet (40 mg total) by mouth daily before breakfast.,  Disp: 90 tablet, Rfl: 3   predniSONE (DELTASONE) 20 MG tablet, Take 20 mg by mouth daily with breakfast., Disp: , Rfl:   Observations/Objective: Patient is well-developed, well-nourished in no acute distress.  Resting comfortably at home.  Head is normocephalic, atraumatic.  No labored breathing. Speech is clear and coherent with logical content.  Patient is alert and oriented at baseline.    Assessment and Plan: 1. COVID-19 - Paxlovid.  Most recent GFR >60 -F/u in person if not improving.  Follow Up Instructions: I discussed the assessment and treatment plan with the patient. The patient was provided an opportunity to ask questions and all were answered. The patient agreed with the plan and demonstrated an understanding of the instructions.  A copy of instructions were sent to the patient via MyChart.  The patient was advised to call back or seek an in-person evaluation if the symptoms worsen or if the condition fails to improve as anticipated.  Time:  I spent 14 minutes with the patient via telehealth technology discussing the above problems/concerns.    Roxy Horseman, PA-C

## 2021-03-27 NOTE — Patient Instructions (Signed)
  You have tested positive for COVID-19, meaning that you were infected with the novel coronavirus and could give the virus to others.  It is vitally important that you stay home so you do not spread it to others.      Please continue isolation at home, for at least 10 days since the start of your symptoms and until you have had 24 hours with no fever (without taking a fever reducer) and with improving of symptoms.  If you have no symptoms but tested positive (or all symptoms resolve after 5 days and you have no fever) you can leave your house but continue to wear a mask around others for an additional 5 days. If you have a fever,continue to stay home until you have had 24 hours of no fever. Most cases improve 5-10 days from onset but we have seen a small number of patients who have gotten worse after the 10 days.  Please be sure to watch for worsening symptoms and remain taking the proper precautions.   Go to the nearest hospital ED for assessment if fever/cough/breathlessness are severe or illness seems like a threat to life.    The following symptoms may appear 2-14 days after exposure: Fever Cough Shortness of breath or difficulty breathing Chills Repeated shaking with chills Muscle pain Headache Sore throat New loss of taste or smell Fatigue Congestion or runny nose Nausea or vomiting Diarrhea   You may also take acetaminophen (Tylenol) as needed for fever.  HOME CARE: Only take medications as instructed by your medical team. Drink plenty of fluids and get plenty of rest. A steam or ultrasonic humidifier can help if you have congestion.   GET HELP RIGHT AWAY IF YOU HAVE EMERGENCY WARNING SIGNS.  Call 911 or proceed to your closest emergency facility if: You develop worsening high fever. Trouble breathing Bluish lips or face Persistent pain or pressure in the chest New confusion Inability to wake or stay awake You cough up blood. Your symptoms become more severe Inability  to hold down food or fluids  This list is not all possible symptoms. Contact your medical provider for any symptoms that are severe or concerning to you.      

## 2021-04-10 ENCOUNTER — Other Ambulatory Visit: Payer: Self-pay

## 2021-04-10 ENCOUNTER — Other Ambulatory Visit (INDEPENDENT_AMBULATORY_CARE_PROVIDER_SITE_OTHER): Payer: 59

## 2021-04-10 DIAGNOSIS — D500 Iron deficiency anemia secondary to blood loss (chronic): Secondary | ICD-10-CM

## 2021-04-10 LAB — CBC WITH DIFFERENTIAL/PLATELET
Basophils Absolute: 0 10*3/uL (ref 0.0–0.1)
Basophils Relative: 0.4 % (ref 0.0–3.0)
Eosinophils Absolute: 0.1 10*3/uL (ref 0.0–0.7)
Eosinophils Relative: 1.6 % (ref 0.0–5.0)
HCT: 37.5 % (ref 36.0–46.0)
Hemoglobin: 12.5 g/dL (ref 12.0–15.0)
Lymphocytes Relative: 28.1 % (ref 12.0–46.0)
Lymphs Abs: 2.1 10*3/uL (ref 0.7–4.0)
MCHC: 33.3 g/dL (ref 30.0–36.0)
MCV: 91.9 fl (ref 78.0–100.0)
Monocytes Absolute: 0.5 10*3/uL (ref 0.1–1.0)
Monocytes Relative: 6.3 % (ref 3.0–12.0)
Neutro Abs: 4.7 10*3/uL (ref 1.4–7.7)
Neutrophils Relative %: 63.6 % (ref 43.0–77.0)
Platelets: 295 10*3/uL (ref 150.0–400.0)
RBC: 4.08 Mil/uL (ref 3.87–5.11)
RDW: 13.8 % (ref 11.5–15.5)
WBC: 7.3 10*3/uL (ref 4.0–10.5)

## 2021-04-10 LAB — FERRITIN: Ferritin: 14.3 ng/mL (ref 10.0–291.0)

## 2021-04-10 NOTE — Addendum Note (Signed)
Addended by: Madelon Lips on: 04/10/2021 03:22 PM   Modules accepted: Orders

## 2021-04-30 ENCOUNTER — Other Ambulatory Visit: Payer: Self-pay | Admitting: Internal Medicine

## 2021-07-02 ENCOUNTER — Other Ambulatory Visit: Payer: Self-pay

## 2021-07-02 ENCOUNTER — Ambulatory Visit (INDEPENDENT_AMBULATORY_CARE_PROVIDER_SITE_OTHER): Payer: 59 | Admitting: Internal Medicine

## 2021-07-02 ENCOUNTER — Encounter: Payer: Self-pay | Admitting: Internal Medicine

## 2021-07-02 DIAGNOSIS — N393 Stress incontinence (female) (male): Secondary | ICD-10-CM

## 2021-07-02 DIAGNOSIS — Z0000 Encounter for general adult medical examination without abnormal findings: Secondary | ICD-10-CM

## 2021-07-02 DIAGNOSIS — D500 Iron deficiency anemia secondary to blood loss (chronic): Secondary | ICD-10-CM

## 2021-07-02 LAB — CBC WITH DIFFERENTIAL/PLATELET
Basophils Absolute: 0 10*3/uL (ref 0.0–0.1)
Basophils Relative: 0.4 % (ref 0.0–3.0)
Eosinophils Absolute: 0.1 10*3/uL (ref 0.0–0.7)
Eosinophils Relative: 1.9 % (ref 0.0–5.0)
HCT: 36.8 % (ref 36.0–46.0)
Hemoglobin: 12.3 g/dL (ref 12.0–15.0)
Lymphocytes Relative: 21.5 % (ref 12.0–46.0)
Lymphs Abs: 1.7 10*3/uL (ref 0.7–4.0)
MCHC: 33.5 g/dL (ref 30.0–36.0)
MCV: 90.6 fl (ref 78.0–100.0)
Monocytes Absolute: 0.5 10*3/uL (ref 0.1–1.0)
Monocytes Relative: 5.8 % (ref 3.0–12.0)
Neutro Abs: 5.5 10*3/uL (ref 1.4–7.7)
Neutrophils Relative %: 70.4 % (ref 43.0–77.0)
Platelets: 290 10*3/uL (ref 150.0–400.0)
RBC: 4.06 Mil/uL (ref 3.87–5.11)
RDW: 12.4 % (ref 11.5–15.5)
WBC: 7.8 10*3/uL (ref 4.0–10.5)

## 2021-07-02 LAB — LIPID PANEL
Cholesterol: 173 mg/dL (ref 0–200)
HDL: 52.5 mg/dL (ref >39.00)
LDL Cholesterol: 106 mg/dL — ABNORMAL HIGH (ref 0–99)
NonHDL: 120.37
Total CHOL/HDL Ratio: 3
Triglycerides: 71 mg/dL (ref 0.0–149.0)
VLDL: 14.2 mg/dL (ref 0.0–40.0)

## 2021-07-02 LAB — URINALYSIS, ROUTINE W REFLEX MICROSCOPIC
Bilirubin Urine: NEGATIVE
Ketones, ur: NEGATIVE
Nitrite: NEGATIVE
Specific Gravity, Urine: 1.015 (ref 1.000–1.030)
Total Protein, Urine: NEGATIVE
Urine Glucose: NEGATIVE
Urobilinogen, UA: 0.2 (ref 0.0–1.0)
pH: 6.5 (ref 5.0–8.0)

## 2021-07-02 LAB — COMPREHENSIVE METABOLIC PANEL
ALT: 12 U/L (ref 0–35)
AST: 18 U/L (ref 0–37)
Albumin: 4 g/dL (ref 3.5–5.2)
Alkaline Phosphatase: 57 U/L (ref 39–117)
BUN: 10 mg/dL (ref 6–23)
CO2: 29 mEq/L (ref 19–32)
Calcium: 9.7 mg/dL (ref 8.4–10.5)
Chloride: 102 mEq/L (ref 96–112)
Creatinine, Ser: 0.77 mg/dL (ref 0.40–1.20)
GFR: 92.22 mL/min (ref >60.00)
Glucose, Bld: 100 mg/dL — ABNORMAL HIGH (ref 70–99)
Potassium: 4.1 mEq/L (ref 3.5–5.1)
Sodium: 138 mEq/L (ref 135–145)
Total Bilirubin: 0.3 mg/dL (ref 0.2–1.2)
Total Protein: 7.2 g/dL (ref 6.0–8.3)

## 2021-07-02 LAB — TSH: TSH: 2.41 u[IU]/mL (ref 0.35–5.50)

## 2021-07-02 MED ORDER — METOPROLOL SUCCINATE ER 25 MG PO TB24: 25.0000 mg | ORAL_TABLET | Freq: Every day | ORAL | 3 refills | Status: DC

## 2021-07-02 MED ORDER — PANTOPRAZOLE SODIUM 40 MG PO TBEC: 40.0000 mg | DELAYED_RELEASE_TABLET | Freq: Every day | ORAL | 3 refills | Status: DC

## 2021-07-02 NOTE — Progress Notes (Signed)
Subjective:  Patient ID: Pamela Thompson, female    DOB: 01/12/1976  Age: 45 y.o. MRN: [id number]  CC: Annual Exam   HPI Pamela Thompson presents for a well exam  F/u anemia - had IV iron, had endometrial ablation Pt had R radial tunnel syndrome  Outpatient Medications Prior to Visit  Medication Sig Dispense Refill   Ascorbic Acid (VITAMIN C PO) Take 1 tablet by mouth daily.     b complex vitamins capsule Take 1 capsule by mouth every other day.     Cholecalciferol (VITAMIN D3) 2000 units TABS Take 1 tablet by mouth daily.     metoprolol succinate (TOPROL-XL) 25 MG 24 hr tablet Take 1 tablet (25 mg total) by mouth daily. Annual appt due in Dec must see provider for future refills 90 tablet 0   pantoprazole (PROTONIX) 40 MG tablet Take 1 tablet (40 mg total) by mouth daily before breakfast. 90 tablet 3   aspirin (BAYER ASPIRIN) 325 MG tablet Take 1 tablet for prolonged trips (Patient not taking: Reported on 12/25/2020) 100 tablet 3   predniSONE (DELTASONE) 20 MG tablet Take 20 mg by mouth daily with breakfast.     No facility-administered medications prior to visit.    ROS: Review of Systems  Constitutional:  Negative for activity change, appetite change, chills, fatigue and unexpected weight change.  HENT:  Negative for congestion, mouth sores and sinus pressure.   Eyes:  Negative for visual disturbance.  Respiratory:  Negative for cough and chest tightness.   Gastrointestinal:  Negative for abdominal pain and nausea.  Genitourinary:  Negative for difficulty urinating, frequency and vaginal pain.  Musculoskeletal:  Negative for back pain and gait problem.  Skin:  Negative for pallor and rash.  Neurological:  Negative for dizziness, tremors, weakness, numbness and headaches.  Psychiatric/Behavioral:  Negative for confusion, sleep disturbance and suicidal ideas.    Objective:  BP 110/72 (BP Location: Left Arm)    Pulse 80    Temp 98.3 F (36.8 C) (Oral)    Ht 5\' 3"  (1.6 m)    Wt  175 lb 9.6 oz (79.7 kg)    SpO2 97%    BMI 31.11 kg/m   BP Readings from Last 3 Encounters:  07/02/21 110/72  01/01/21 (!) 108/52  12/25/20 120/75    Wt Readings from Last 3 Encounters:  07/02/21 175 lb 9.6 oz (79.7 kg)  01/01/21 173 lb 12 oz (78.8 kg)  12/06/20 174 lb (78.9 kg)    Physical Exam Constitutional:      General: She is not in acute distress.    Appearance: She is well-developed.  HENT:     Head: Normocephalic.     Right Ear: External ear normal.     Left Ear: External ear normal.     Nose: Nose normal.  Eyes:     General:        Right eye: No discharge.        Left eye: No discharge.     Conjunctiva/sclera: Conjunctivae normal.     Pupils: Pupils are equal, round, and reactive to light.  Neck:     Thyroid: No thyromegaly.     Vascular: No JVD.     Trachea: No tracheal deviation.  Cardiovascular:     Rate and Rhythm: Normal rate and regular rhythm.     Heart sounds: Normal heart sounds.  Pulmonary:     Effort: No respiratory distress.     Breath sounds: No stridor. No wheezing.  Abdominal:     General: Bowel sounds are normal. There is no distension.     Palpations: Abdomen is soft. There is no mass.     Tenderness: There is no abdominal tenderness. There is no guarding or rebound.  Musculoskeletal:        General: No tenderness.     Cervical back: Normal range of motion and neck supple. No rigidity.  Lymphadenopathy:     Cervical: No cervical adenopathy.  Skin:    Findings: No erythema or rash.  Neurological:     Cranial Nerves: No cranial nerve deficit.     Motor: No abnormal muscle tone.     Coordination: Coordination normal.     Deep Tendon Reflexes: Reflexes normal.  Psychiatric:        Behavior: Behavior normal.        Thought Content: Thought content normal.        Judgment: Judgment normal.    Lab Results  Component Value Date   WBC 7.3 04/10/2021   HGB 12.5 04/10/2021   HCT 37.5 04/10/2021   PLT 295.0 04/10/2021   GLUCOSE 85  12/06/2020   CHOL 190 06/28/2020   TRIG 59.0 06/28/2020   HDL 61.30 06/28/2020   LDLCALC 117 (H) 06/28/2020   ALT 12 12/06/2020   AST 15 12/06/2020   NA 138 12/06/2020   K 3.6 12/06/2020   CL 101 12/06/2020   CREATININE 0.82 12/06/2020   BUN 16 12/06/2020   CO2 29 12/06/2020   TSH 2.17 06/28/2020   INR 0.88 07/02/2018    DG Chest Port 1 View  Result Date: 10/06/2018 CLINICAL DATA:  Cough with chest burning. EXAM: PORTABLE CHEST 1 VIEW COMPARISON:  None. FINDINGS: 1828 hours. Two views obtained. The heart size and mediastinal contours are normal. The lungs are clear. There is no pleural effusion or pneumothorax. No acute osseous findings are identified. IMPRESSION: No active cardiopulmonary process. Electronically Signed   By: Carey Bullocks M.D.   On: 10/06/2018 19:10    Assessment & Plan:   Problem List Items Addressed This Visit     Iron deficiency anemia    F/u anemia - had IV iron, had endometrial ablation      Relevant Orders   Iron, TIBC and Ferritin Panel   Stress incontinence, female    In pelvic floor PT      Well adult exam    We discussed age appropriate health related issues, including available/recomended screening tests and vaccinations. Labs were ordered to be later reviewed . All questions were answered. We discussed one or more of the following - seat belt use, use of sunscreen/sun exposure exercise, safe sex, fall risk reduction, second hand smoke exposure, firearm use and storage, seat belt use, a need for adhering to healthy diet and exercise. Labs were ordered.  All questions were answered. Last colon 2019 due in 2024      Relevant Orders   TSH   Urinalysis   CBC with Differential/Platelet   Lipid panel   Comprehensive metabolic panel      No orders of the defined types were placed in this encounter.     Follow-up: No follow-ups on file.  Sonda Primes, MD

## 2021-07-02 NOTE — Assessment & Plan Note (Signed)
In pelvic floor PT

## 2021-07-02 NOTE — Assessment & Plan Note (Signed)
We discussed age appropriate health related issues, including available/recomended screening tests and vaccinations. Labs were ordered to be later reviewed . All questions were answered. We discussed one or more of the following - seat belt use, use of sunscreen/sun exposure exercise, safe sex, fall risk reduction, second hand smoke exposure, firearm use and storage, seat belt use, a need for adhering to healthy diet and exercise. Labs were ordered.  All questions were answered. Last colon 2019 due in 2024 

## 2021-07-02 NOTE — Assessment & Plan Note (Signed)
F/u anemia - had IV iron, had endometrial ablation

## 2021-07-03 LAB — IRON,TIBC AND FERRITIN PANEL
%SAT: 10 % (calc) — ABNORMAL LOW (ref 16–45)
Ferritin: 7 ng/mL — ABNORMAL LOW (ref 16–232)
Iron: 34 ug/dL — ABNORMAL LOW (ref 40–190)
TIBC: 352 mcg/dL (calc) (ref 250–450)

## 2021-11-25 ENCOUNTER — Telehealth: Payer: Self-pay

## 2021-11-25 NOTE — Telephone Encounter (Signed)
Patient called and stated she found a bat in her house over the weekend, which was removed by animal control. Test results pending on bat. Patient states she was advised by EMS to seek care at the ED, but asked if she should wait for pending test results. ? ?Advised patient to seek care per advice from EMS. Patient stated understanding and had no additional questions. ? ?Wyvonne Lenz, RN  ?

## 2021-11-26 ENCOUNTER — Encounter: Payer: Self-pay | Admitting: Internal Medicine

## 2021-11-27 ENCOUNTER — Emergency Department (HOSPITAL_BASED_OUTPATIENT_CLINIC_OR_DEPARTMENT_OTHER)
Admission: EM | Admit: 2021-11-27 | Discharge: 2021-11-27 | Disposition: A | Discharge status: Home / Self Care | Payer: 59 | Attending: Emergency Medicine | Admitting: Emergency Medicine

## 2021-11-27 ENCOUNTER — Encounter (HOSPITAL_BASED_OUTPATIENT_CLINIC_OR_DEPARTMENT_OTHER): Payer: Self-pay | Admitting: Urology

## 2021-11-27 ENCOUNTER — Other Ambulatory Visit: Payer: Self-pay

## 2021-11-27 DIAGNOSIS — Z23 Encounter for immunization: Secondary | ICD-10-CM | POA: Diagnosis not present

## 2021-11-27 DIAGNOSIS — Z203 Contact with and (suspected) exposure to rabies: Secondary | ICD-10-CM | POA: Insufficient documentation

## 2021-11-27 DIAGNOSIS — Z2914 Encounter for prophylactic rabies immune globin: Secondary | ICD-10-CM | POA: Diagnosis not present

## 2021-11-27 MED ORDER — RABIES IMMUNE GLOBULIN 150 UNIT/ML IM INJ
20.0000 [IU]/kg | INJECTION | Freq: Once | INTRAMUSCULAR | Status: AC
  Administered 2021-11-27: 1650 [IU] via INTRAMUSCULAR
  Filled 2021-11-27: qty 2

## 2021-11-27 MED ORDER — RABIES VACCINE, PCEC IM SUSR
1.0000 mL | Freq: Once | INTRAMUSCULAR | Status: AC
Start: 2021-11-27 — End: 2021-11-27
  Administered 2021-11-27: 1 mL via INTRAMUSCULAR
  Filled 2021-11-27: qty 1

## 2021-11-27 MED ORDER — TETANUS-DIPHTH-ACELL PERTUSSIS 5-2.5-18.5 LF-MCG/0.5 IM SUSY
0.5000 mL | PREFILLED_SYRINGE | Freq: Once | INTRAMUSCULAR | Status: AC
  Administered 2021-11-27: 0.5 mL via INTRAMUSCULAR
  Filled 2021-11-27: qty 0.5

## 2021-11-27 NOTE — Discharge Instructions (Signed)
?  You have been seen in the Emergency Department for a possible rabies exposure. It's very important you return for the additional vaccine doses.  Please call the clinic listed below for hours of operation. ? ? ?Clinic that will administer your rabies vaccines: [facility] at [place] [phone] ?  ? ?DAY 0:  11/27/2021     ? ?DAY 3:  11/30/2021      ? ?DAY 7:  12/04/2021    ? ?DAY 14:  12/11/2021       ? ? ? ? ?

## 2021-11-27 NOTE — ED Provider Notes (Signed)
?[facility] EMERGENCY DEPARTMENT ?Provider Note ? ? ?CSN: [id number] ?Arrival date & time: 11/27/21  1326 ? ?  ? ?History ? ?Chief Complaint  ?Patient presents with  ? Animal Bite  ? ? ?Pamela Thompson is a 46 y.o. female who presents today for evaluation of a possible rabies exposure. ?On Monday morning she woke up to find a bat flying in her bedroom.  She states that animal control took the bat for testing and she has not heard results yet.  She has contacted both her primary care doctor and attempted to contact ID for guidance regarding rabies vaccines. ?She states that even though she has not had results yet at this point due to her anxiety over the situation she wishes to proceed with rabies vaccines. ?She states that she thinks she may have been bitten on her ankle. ? ?Her last tetanus shot was November 2013. ? ?She denies any prior reactions to vaccine. ? ?HPI ? ?  ? ?Home Medications ?Prior to Admission medications   ?Medication Sig Start Date End Date Taking? Authorizing Provider  ?Ascorbic Acid (VITAMIN C PO) Take 1 tablet by mouth daily.    [provider]  ?b complex vitamins capsule Take 1 capsule by mouth every other day.    [provider]  ?Cholecalciferol (VITAMIN D3) 2000 units TABS Take 1 tablet by mouth daily.    [provider]  ?metoprolol succinate (TOPROL-XL) 25 MG 24 hr tablet Take 1 tablet (25 mg total) by mouth daily. Annual appt due in Dec must see provider for future refills 07/02/21   Plotnikov, Georgina Quint, MD  ?pantoprazole (PROTONIX) 40 MG tablet Take 1 tablet (40 mg total) by mouth daily before breakfast. 07/02/21   Plotnikov, Georgina Quint, MD  ?   ? ?Allergies    ?Minocycline and Penicillins   ? ?Review of Systems   ?Review of Systems ?See above ?Physical Exam ?Updated Vital Signs ?BP (!) 126/99 (BP Location: Right Arm)   Pulse 83   Temp 98.6 ?F (37 ?C) (Oral)   Resp 18   Ht 5\' 3"  (1.6 m)   Wt 79.7 kg   LMP 11/11/2021 (Approximate)   SpO2 99%    BMI 31.13 kg/m?  ?Physical Exam ?Vitals and nursing note reviewed.  ?Constitutional:   ?   General: She is not in acute distress. ?HENT:  ?   Head: Normocephalic and atraumatic.  ?Cardiovascular:  ?   Rate and Rhythm: Normal rate.  ?Pulmonary:  ?   Effort: Pulmonary effort is normal. No respiratory distress.  ?Musculoskeletal:  ?   Cervical back: No rigidity.  ?Skin: ?   General: Skin is warm and dry.  ?Neurological:  ?   Mental Status: She is alert. Mental status is at baseline.  ?   Comments: Awake and alert, answers all questions appropriately.  Speech is not slurred.    ?Psychiatric:     ?   Mood and Affect: Mood normal.  ? ? ?ED Results / Procedures / Treatments   ?Labs ?(all labs ordered are listed, but only abnormal results are displayed) ?Labs Reviewed - No data to display ? ?EKG ?None ? ?Radiology ?No results found. ? ?Procedures ?Procedures  ? ? ?Medications Ordered in ED ?Medications - No data to display ? ?ED Course/ Medical Decision Making/ A&P ?  ?                        ?Medical Decision Making ?Patient is  a 46 year old woman who presents today for evaluation of possible rabies exposure.  2 days ago she awoke to a bat flying in her bedroom.  Animal control has taken the bat however she has not heard back about the results yet and wishes for initiation of rabies postexposure prophylaxis while awaiting results on animal testing on the bat. ?We discussed risks of rabies vaccine at length and patient wishes to receive vaccine.  She is given rabies immunoglobulin, vaccine and her tetanus is updated.  She is given instructions on receiving the additional follow-up vaccines and states her understanding. ? ?Return precautions were discussed with patient who states their understanding.  At the time of discharge patient denied any unaddressed complaints or concerns.  Patient is agreeable for discharge home. ? ?Note: Portions of this report may have been transcribed using voice recognition software. Every  effort was made to ensure accuracy; however, inadvertent computerized transcription errors may be present ? ? ? ?Problems Addressed: ?Need for diphtheria-tetanus-pertussis (Tdap) vaccine: acute illness or injury ?Need for post exposure prophylaxis for rabies: acute illness or injury ? ?Amount and/or Complexity of Data Reviewed ?External Data Reviewed: notes. ?   Details: Notes from patient's communications with PCP, infectious disease ? ?Risk ?Prescription drug management. ?Decision regarding hospitalization. ? ? ? ? ? ? ? ? ? ?Final Clinical Impression(s) / ED Diagnoses ?Final diagnoses:  ?None  ? ? ?Rx / DC Orders ?ED Discharge Orders   ? ? None  ? ?  ? ? ?  ?Cristina Gong, [place] ?11/27/21 1451 ? ?  ?Virgina Norfolk, DO ?11/27/21 1501 ? ?

## 2021-11-27 NOTE — ED Triage Notes (Signed)
Pt states had Bat in home on Monday, noticed small area on left ankle that could have been a bite ?Animal control has custody of Bat and are testing it ? ?

## 2021-11-30 ENCOUNTER — Ambulatory Visit (HOSPITAL_COMMUNITY)
Admission: RE | Admit: 2021-11-30 | Discharge: 2021-11-30 | Disposition: A | Discharge status: Home / Self Care | Payer: 59 | Source: Ambulatory Visit | Attending: Physician Assistant | Admitting: Physician Assistant

## 2021-11-30 DIAGNOSIS — Z203 Contact with and (suspected) exposure to rabies: Secondary | ICD-10-CM | POA: Diagnosis not present

## 2021-11-30 MED ORDER — RABIES VACCINE, PCEC IM SUSR
1.0000 mL | Freq: Once | INTRAMUSCULAR | Status: AC
  Administered 2021-11-30: 1 mL via INTRAMUSCULAR

## 2021-11-30 MED ORDER — RABIES VACCINE, PCEC IM SUSR
INTRAMUSCULAR | Status: AC
  Filled 2021-11-30: qty 1

## 2021-11-30 NOTE — ED Triage Notes (Signed)
Pt reports for rabies injection. Denies any concerns.

## 2021-11-30 NOTE — Discharge Instructions (Signed)
Return 12/04/21 for next injection.

## 2021-12-04 ENCOUNTER — Encounter (HOSPITAL_COMMUNITY): Payer: Self-pay

## 2021-12-04 ENCOUNTER — Ambulatory Visit (HOSPITAL_COMMUNITY)
Admission: RE | Admit: 2021-12-04 | Discharge: 2021-12-04 | Disposition: A | Discharge status: Home / Self Care | Payer: 59 | Source: Ambulatory Visit | Attending: Internal Medicine | Admitting: Internal Medicine

## 2021-12-04 DIAGNOSIS — Z203 Contact with and (suspected) exposure to rabies: Secondary | ICD-10-CM

## 2021-12-04 MED ORDER — RABIES VACCINE, PCEC IM SUSR
1.0000 mL | Freq: Once | INTRAMUSCULAR | Status: AC
  Administered 2021-12-04: 1 mL via INTRAMUSCULAR

## 2021-12-04 MED ORDER — RABIES VACCINE, PCEC IM SUSR
INTRAMUSCULAR | Status: AC
  Filled 2021-12-04: qty 1

## 2021-12-04 NOTE — ED Triage Notes (Signed)
Pt here to get 3rd injection  denies any complications

## 2021-12-11 ENCOUNTER — Ambulatory Visit (HOSPITAL_COMMUNITY)
Admission: RE | Admit: 2021-12-11 | Discharge: 2021-12-11 | Disposition: A | Discharge status: Home / Self Care | Payer: 59 | Source: Ambulatory Visit | Attending: Internal Medicine | Admitting: Internal Medicine

## 2021-12-11 DIAGNOSIS — Z203 Contact with and (suspected) exposure to rabies: Secondary | ICD-10-CM | POA: Diagnosis not present

## 2021-12-11 MED ORDER — RABIES VACCINE, PCEC IM SUSR
INTRAMUSCULAR | Status: AC
  Filled 2021-12-11: qty 1

## 2021-12-11 MED ORDER — RABIES VACCINE, PCEC IM SUSR
1.0000 mL | Freq: Once | INTRAMUSCULAR | Status: AC
  Administered 2021-12-11: 1 mL via INTRAMUSCULAR

## 2021-12-11 NOTE — ED Triage Notes (Signed)
Patient presenting for fourth and final rabies vaccine. Given in the left arm. Tolerated well.

## 2022-01-01 ENCOUNTER — Ambulatory Visit: Payer: 59 | Admitting: Internal Medicine

## 2022-01-09 ENCOUNTER — Encounter: Payer: Self-pay | Admitting: Internal Medicine

## 2022-01-09 ENCOUNTER — Ambulatory Visit (INDEPENDENT_AMBULATORY_CARE_PROVIDER_SITE_OTHER): Payer: 59 | Admitting: Internal Medicine

## 2022-01-09 DIAGNOSIS — M722 Plantar fascial fibromatosis: Secondary | ICD-10-CM | POA: Diagnosis not present

## 2022-01-09 DIAGNOSIS — K200 Eosinophilic esophagitis: Secondary | ICD-10-CM

## 2022-01-09 DIAGNOSIS — D500 Iron deficiency anemia secondary to blood loss (chronic): Secondary | ICD-10-CM | POA: Diagnosis not present

## 2022-01-09 DIAGNOSIS — D6861 Antiphospholipid syndrome: Secondary | ICD-10-CM

## 2022-01-09 DIAGNOSIS — R002 Palpitations: Secondary | ICD-10-CM

## 2022-01-09 LAB — COMPREHENSIVE METABOLIC PANEL
ALT: 19 U/L (ref 0–35)
AST: 23 U/L (ref 0–37)
Albumin: 4.5 g/dL (ref 3.5–5.2)
Alkaline Phosphatase: 48 U/L (ref 39–117)
BUN: 17 mg/dL (ref 6–23)
CO2: 28 mEq/L (ref 19–32)
Calcium: 9.8 mg/dL (ref 8.4–10.5)
Chloride: 103 mEq/L (ref 96–112)
Creatinine, Ser: 0.84 mg/dL (ref 0.40–1.20)
GFR: 82.77 mL/min (ref >60.00)
Glucose, Bld: 94 mg/dL (ref 70–99)
Potassium: 3.9 mEq/L (ref 3.5–5.1)
Sodium: 136 mEq/L (ref 135–145)
Total Bilirubin: 0.5 mg/dL (ref 0.2–1.2)
Total Protein: 7.4 g/dL (ref 6.0–8.3)

## 2022-01-09 LAB — CBC WITH DIFFERENTIAL/PLATELET
Basophils Absolute: 0 10*3/uL (ref 0.0–0.1)
Basophils Relative: 0.3 % (ref 0.0–3.0)
Eosinophils Absolute: 0.1 10*3/uL (ref 0.0–0.7)
Eosinophils Relative: 2.1 % (ref 0.0–5.0)
HCT: 38.8 % (ref 36.0–46.0)
Hemoglobin: 13 g/dL (ref 12.0–15.0)
Lymphocytes Relative: 28.9 % (ref 12.0–46.0)
Lymphs Abs: 1.7 10*3/uL (ref 0.7–4.0)
MCHC: 33.6 g/dL (ref 30.0–36.0)
MCV: 92.1 fl (ref 78.0–100.0)
Monocytes Absolute: 0.4 10*3/uL (ref 0.1–1.0)
Monocytes Relative: 7.1 % (ref 3.0–12.0)
Neutro Abs: 3.5 10*3/uL (ref 1.4–7.7)
Neutrophils Relative %: 61.6 % (ref 43.0–77.0)
Platelets: 240 10*3/uL (ref 150.0–400.0)
RBC: 4.21 Mil/uL (ref 3.87–5.11)
RDW: 13.9 % (ref 11.5–15.5)
WBC: 5.8 10*3/uL (ref 4.0–10.5)

## 2022-01-09 NOTE — Progress Notes (Signed)
Subjective:  Patient ID: Pamela Thompson, female    DOB: 24-May-1976  Age: 46 y.o. MRN: [id number]  CC: No chief complaint on file.   HPI Pamela Thompson presents for L plantar fasciitis - seeing Dr Victorino Dike. F/u on POTS, anemia  Outpatient Medications Prior to Visit  Medication Sig Dispense Refill   Ascorbic Acid (VITAMIN C PO) Take 1 tablet by mouth daily.     b complex vitamins capsule Take 1 capsule by mouth every other day.     Cholecalciferol (VITAMIN D3) 2000 units TABS Take 1 tablet by mouth daily.     metoprolol succinate (TOPROL-XL) 25 MG 24 hr tablet Take 1 tablet (25 mg total) by mouth daily. Annual appt due in Dec must see provider for future refills 90 tablet 3   pantoprazole (PROTONIX) 40 MG tablet Take 1 tablet (40 mg total) by mouth daily before breakfast. 90 tablet 3   No facility-administered medications prior to visit.    ROS: Review of Systems  Constitutional:  Negative for activity change, appetite change, chills, fatigue and unexpected weight change.  HENT:  Negative for congestion, mouth sores and sinus pressure.   Eyes:  Negative for visual disturbance.  Respiratory:  Negative for cough and chest tightness.   Gastrointestinal:  Negative for abdominal pain and nausea.  Genitourinary:  Negative for difficulty urinating, frequency and vaginal pain.  Musculoskeletal:  Positive for arthralgias and gait problem. Negative for back pain.  Skin:  Negative for pallor and rash.  Neurological:  Negative for dizziness, tremors, weakness, numbness and headaches.  Psychiatric/Behavioral:  Negative for confusion, sleep disturbance and suicidal ideas.     Objective:  BP 110/80 (BP Location: Left Arm, Patient Position: Sitting, Cuff Size: Normal)   Pulse 73   Temp 98.1 F (36.7 C) (Oral)   Ht 5\' 3"  (1.6 m)   Wt 175 lb (79.4 kg)   SpO2 96%   BMI 31.00 kg/m   BP Readings from Last 3 Encounters:  01/09/22 110/80  11/27/21 117/77  07/02/21 110/72    Wt Readings  from Last 3 Encounters:  01/09/22 175 lb (79.4 kg)  11/27/21 (S) 178 lb 12.7 oz (81.1 kg)  07/02/21 175 lb 9.6 oz (79.7 kg)    Physical Exam Constitutional:      General: She is not in acute distress.    Appearance: She is well-developed.  HENT:     Head: Normocephalic.     Right Ear: External ear normal.     Left Ear: External ear normal.     Nose: Nose normal.  Eyes:     General:        Right eye: No discharge.        Left eye: No discharge.     Conjunctiva/sclera: Conjunctivae normal.     Pupils: Pupils are equal, round, and reactive to light.  Neck:     Thyroid: No thyromegaly.     Vascular: No JVD.     Trachea: No tracheal deviation.  Cardiovascular:     Rate and Rhythm: Normal rate and regular rhythm.     Heart sounds: Normal heart sounds.  Pulmonary:     Effort: No respiratory distress.     Breath sounds: No stridor. No wheezing.  Abdominal:     General: Bowel sounds are normal. There is no distension.     Palpations: Abdomen is soft. There is no mass.     Tenderness: There is no abdominal tenderness. There is no guarding or rebound.  Musculoskeletal:        General: No tenderness.     Cervical back: Normal range of motion and neck supple. No rigidity.  Lymphadenopathy:     Cervical: No cervical adenopathy.  Skin:    Findings: No erythema or rash.  Neurological:     Cranial Nerves: No cranial nerve deficit.     Motor: No abnormal muscle tone.     Coordination: Coordination normal.     Gait: Gait abnormal.     Deep Tendon Reflexes: Reflexes normal.  Psychiatric:        Behavior: Behavior normal.        Thought Content: Thought content normal.        Judgment: Judgment normal.   L heel w/pain  Lab Results  Component Value Date   WBC 7.8 07/02/2021   HGB 12.3 07/02/2021   HCT 36.8 07/02/2021   PLT 290.0 07/02/2021   GLUCOSE 100 (H) 07/02/2021   CHOL 173 07/02/2021   TRIG 71.0 07/02/2021   HDL 52.50 07/02/2021   LDLCALC 106 (H) 07/02/2021   ALT 12  07/02/2021   AST 18 07/02/2021   NA 138 07/02/2021   K 4.1 07/02/2021   CL 102 07/02/2021   CREATININE 0.77 07/02/2021   BUN 10 07/02/2021   CO2 29 07/02/2021   TSH 2.41 07/02/2021   INR 0.88 07/02/2018    No results found.  Assessment & Plan:   Problem List Items Addressed This Visit     Anti-cardiolipin antibody syndrome (HCC)    ASA for travel      Eosinophilic esophagitis    On Pantaprazole      Heart palpitations    Cont on Toprol XL      Relevant Orders   CBC with Differential/Platelet   Comprehensive metabolic panel   Iron, TIBC and Ferritin Panel   Iron deficiency anemia    F/u anemia - had IV iron, had endometrial ablation 2022      Relevant Orders   CBC with Differential/Platelet   Comprehensive metabolic panel   Iron, TIBC and Ferritin Panel   Plantar fasciitis    Blue-Emu cream --use 2-3 times a day      Relevant Orders   CBC with Differential/Platelet   Comprehensive metabolic panel   Iron, TIBC and Ferritin Panel      No orders of the defined types were placed in this encounter.     Follow-up: Return in about 6 months (around 07/11/2022) for Wellness Exam.  Sonda Primes, MD

## 2022-01-09 NOTE — Assessment & Plan Note (Signed)
On Pantaprazole 

## 2022-01-09 NOTE — Patient Instructions (Signed)
Blue-Emu cream -- use 2-3 times a day ? ?

## 2022-01-09 NOTE — Assessment & Plan Note (Signed)
Cont on Toprol XL ?

## 2022-01-09 NOTE — Assessment & Plan Note (Signed)
F/u anemia - had IV iron, had endometrial ablation 2022

## 2022-01-09 NOTE — Assessment & Plan Note (Signed)
ASA for travel 

## 2022-01-09 NOTE — Assessment & Plan Note (Signed)
Blue-Emu cream -- use 2-3 times a day ? ?

## 2022-01-10 LAB — IRON,TIBC AND FERRITIN PANEL
%SAT: 24 % (calc) (ref 16–45)
Ferritin: 7 ng/mL — ABNORMAL LOW (ref 16–232)
Iron: 95 ug/dL (ref 40–190)
TIBC: 398 mcg/dL (calc) (ref 250–450)

## 2022-07-03 ENCOUNTER — Ambulatory Visit (INDEPENDENT_AMBULATORY_CARE_PROVIDER_SITE_OTHER): Payer: 59

## 2022-07-03 ENCOUNTER — Ambulatory Visit (INDEPENDENT_AMBULATORY_CARE_PROVIDER_SITE_OTHER): Payer: 59 | Admitting: Internal Medicine

## 2022-07-03 ENCOUNTER — Encounter: Payer: Self-pay | Admitting: Internal Medicine

## 2022-07-03 VITALS — BP 122/78 | HR 78 | Temp 99.1°F | Ht 63.0 in | Wt 178.0 lb

## 2022-07-03 DIAGNOSIS — Z0001 Encounter for general adult medical examination with abnormal findings: Secondary | ICD-10-CM

## 2022-07-03 DIAGNOSIS — Z0000 Encounter for general adult medical examination without abnormal findings: Secondary | ICD-10-CM

## 2022-07-03 DIAGNOSIS — D500 Iron deficiency anemia secondary to blood loss (chronic): Secondary | ICD-10-CM

## 2022-07-03 DIAGNOSIS — M25551 Pain in right hip: Secondary | ICD-10-CM

## 2022-07-03 LAB — CBC WITH DIFFERENTIAL/PLATELET
Basophils Absolute: 0 10*3/uL (ref 0.0–0.1)
Basophils Relative: 0.3 % (ref 0.0–3.0)
Eosinophils Absolute: 0.2 10*3/uL (ref 0.0–0.7)
Eosinophils Relative: 2.9 % (ref 0.0–5.0)
HCT: 38.9 % (ref 36.0–46.0)
Hemoglobin: 13.4 g/dL (ref 12.0–15.0)
Lymphocytes Relative: 28.5 % (ref 12.0–46.0)
Lymphs Abs: 1.9 10*3/uL (ref 0.7–4.0)
MCHC: 34.3 g/dL (ref 30.0–36.0)
MCV: 92.5 fl (ref 78.0–100.0)
Monocytes Absolute: 0.5 10*3/uL (ref 0.1–1.0)
Monocytes Relative: 7.9 % (ref 3.0–12.0)
Neutro Abs: 4.1 10*3/uL (ref 1.4–7.7)
Neutrophils Relative %: 60.4 % (ref 43.0–77.0)
Platelets: 246 10*3/uL (ref 150.0–400.0)
RBC: 4.21 Mil/uL (ref 3.87–5.11)
RDW: 12.9 % (ref 11.5–15.5)
WBC: 6.8 10*3/uL (ref 4.0–10.5)

## 2022-07-03 LAB — COMPREHENSIVE METABOLIC PANEL
ALT: 19 U/L (ref 0–35)
AST: 24 U/L (ref 0–37)
Albumin: 4.4 g/dL (ref 3.5–5.2)
Alkaline Phosphatase: 47 U/L (ref 39–117)
BUN: 17 mg/dL (ref 6–23)
CO2: 27 mEq/L (ref 19–32)
Calcium: 9.9 mg/dL (ref 8.4–10.5)
Chloride: 100 mEq/L (ref 96–112)
Creatinine, Ser: 0.77 mg/dL (ref 0.40–1.20)
GFR: 91.57 mL/min (ref >60.00)
Glucose, Bld: 93 mg/dL (ref 70–99)
Potassium: 3.9 mEq/L (ref 3.5–5.1)
Sodium: 138 mEq/L (ref 135–145)
Total Bilirubin: 0.5 mg/dL (ref 0.2–1.2)
Total Protein: 7.5 g/dL (ref 6.0–8.3)

## 2022-07-03 LAB — LIPID PANEL
Cholesterol: 201 mg/dL — ABNORMAL HIGH (ref 0–200)
HDL: 70.2 mg/dL (ref >39.00)
LDL Cholesterol: 109 mg/dL — ABNORMAL HIGH (ref 0–99)
NonHDL: 130.78
Total CHOL/HDL Ratio: 3
Triglycerides: 110 mg/dL (ref 0.0–149.0)
VLDL: 22 mg/dL (ref 0.0–40.0)

## 2022-07-03 LAB — URINALYSIS
Bilirubin Urine: NEGATIVE
Hgb urine dipstick: NEGATIVE
Ketones, ur: NEGATIVE
Leukocytes,Ua: NEGATIVE
Nitrite: NEGATIVE
Specific Gravity, Urine: <=1.005 — AB (ref 1.000–1.030)
Total Protein, Urine: NEGATIVE
Urine Glucose: NEGATIVE
Urobilinogen, UA: 0.2 (ref 0.0–1.0)
pH: 6.5 (ref 5.0–8.0)

## 2022-07-03 LAB — TSH: TSH: 2.77 u[IU]/mL (ref 0.35–5.50)

## 2022-07-03 NOTE — Progress Notes (Signed)
Subjective:  Patient ID: Pamela Thompson, female    DOB: [birth year]  Age: 46 y.o. MRN: [id number]  CC: Annual Exam   HPI Pamela Thompson presents for a well exam Plantar fasciitis, anemia f/u  Outpatient Medications Prior to Visit  Medication Sig Dispense Refill   Ascorbic Acid (VITAMIN C PO) Take 1 tablet by mouth daily.     b complex vitamins capsule Take 1 capsule by mouth daily.     Cholecalciferol (VITAMIN D3) 2000 units TABS Take 1 tablet by mouth daily.     metoprolol succinate (TOPROL-XL) 25 MG 24 hr tablet Take 1 tablet (25 mg total) by mouth daily. Annual appt due in Dec must see provider for future refills 90 tablet 3   pantoprazole (PROTONIX) 40 MG tablet Take 1 tablet (40 mg total) by mouth daily before breakfast. 90 tablet 3   No facility-administered medications prior to visit.    ROS: Review of Systems  Constitutional:  Negative for activity change, appetite change, chills, fatigue and unexpected weight change.  HENT:  Negative for congestion, mouth sores and sinus pressure.   Eyes:  Negative for visual disturbance.  Respiratory:  Negative for cough and chest tightness.   Gastrointestinal:  Negative for abdominal pain and nausea.  Genitourinary:  Negative for difficulty urinating, frequency and vaginal pain.  Musculoskeletal:  Negative for back pain and gait problem.  Skin:  Negative for pallor and rash.  Neurological:  Negative for dizziness, tremors, weakness, numbness and headaches.  Psychiatric/Behavioral:  Negative for confusion, sleep disturbance and suicidal ideas.     Objective:  BP 122/78 (BP Location: Right Arm, Patient Position: Sitting, Cuff Size: Normal)   Pulse 78   Temp 99.1 F (37.3 C) (Oral)   Ht 5\' 3"  (1.6 m)   Wt 178 lb (80.7 kg)   SpO2 99%   BMI 31.53 kg/m   BP Readings from Last 3 Encounters:  07/03/22 122/78  01/09/22 110/80  11/27/21 117/77    Wt Readings from Last 3 Encounters:  07/03/22 178 lb (80.7 kg)  01/09/22 175 lb  (79.4 kg)  11/27/21 (S) 178 lb 12.7 oz (81.1 kg)    Physical Exam Constitutional:      General: She is not in acute distress.    Appearance: She is well-developed.  HENT:     Head: Normocephalic.     Right Ear: External ear normal.     Left Ear: External ear normal.     Nose: Nose normal.  Eyes:     General:        Right eye: No discharge.        Left eye: No discharge.     Conjunctiva/sclera: Conjunctivae normal.     Pupils: Pupils are equal, round, and reactive to light.  Neck:     Thyroid: No thyromegaly.     Vascular: No JVD.     Trachea: No tracheal deviation.  Cardiovascular:     Rate and Rhythm: Normal rate and regular rhythm.     Heart sounds: Normal heart sounds.  Pulmonary:     Effort: No respiratory distress.     Breath sounds: No stridor. No wheezing.  Abdominal:     General: Bowel sounds are normal. There is no distension.     Palpations: Abdomen is soft. There is no mass.     Tenderness: There is no abdominal tenderness. There is no guarding or rebound.  Musculoskeletal:        General: No tenderness.  Cervical back: Normal range of motion and neck supple. No rigidity.  Lymphadenopathy:     Cervical: No cervical adenopathy.  Skin:    Findings: No erythema or rash.  Neurological:     Cranial Nerves: No cranial nerve deficit.     Motor: No abnormal muscle tone.     Coordination: Coordination normal.     Deep Tendon Reflexes: Reflexes normal.  Psychiatric:        Behavior: Behavior normal.        Thought Content: Thought content normal.        Judgment: Judgment normal.   R lat hip w/pain on palpation  Lab Results  Component Value Date   WBC 5.8 01/09/2022   HGB 13.0 01/09/2022   HCT 38.8 01/09/2022   PLT 240.0 01/09/2022   GLUCOSE 94 01/09/2022   CHOL 173 07/02/2021   TRIG 71.0 07/02/2021   HDL 52.50 07/02/2021   LDLCALC 106 (H) 07/02/2021   ALT 19 01/09/2022   AST 23 01/09/2022   NA 136 01/09/2022   K 3.9 01/09/2022   CL 103  01/09/2022   CREATININE 0.84 01/09/2022   BUN 17 01/09/2022   CO2 28 01/09/2022   TSH 2.41 07/02/2021   INR 0.88 07/02/2018    No results found.  Assessment & Plan:   Problem List Items Addressed This Visit     Well adult exam - Primary    We discussed age appropriate health related issues, including available/recomended screening tests and vaccinations. Labs were ordered to be later reviewed . All questions were answered. We discussed one or more of the following - seat belt use, use of sunscreen/sun exposure exercise, safe sex, fall risk reduction, second hand smoke exposure, firearm use and storage, seat belt use, a need for adhering to healthy diet and exercise. Labs were ordered.  All questions were answered. Last colon 2019 due in 2024      Relevant Orders   TSH   Urinalysis   CBC with Differential/Platelet   Lipid panel   Comprehensive metabolic panel   Pain of right hip    Trochanteric bursitis R X ray Hip opener exercises Blue-Emu cream was recommended to use 2-3 times a day       Relevant Orders   DG HIP UNILAT WITH PELVIS 2-3 VIEWS RIGHT   Iron deficiency anemia    S/p endometrial ablation 2022 Consider Accrufer      Relevant Orders   Iron, TIBC and Ferritin Panel      No orders of the defined types were placed in this encounter.     Follow-up: Return in about 6 months (around 01/02/2023) for a follow-up visit.  Sonda Primes, MD

## 2022-07-03 NOTE — Assessment & Plan Note (Signed)
Trochanteric bursitis R X ray Hip opener exercises Blue-Emu cream was recommended to use 2-3 times a day

## 2022-07-03 NOTE — Assessment & Plan Note (Signed)
We discussed age appropriate health related issues, including available/recomended screening tests and vaccinations. Labs were ordered to be later reviewed . All questions were answered. We discussed one or more of the following - seat belt use, use of sunscreen/sun exposure exercise, safe sex, fall risk reduction, second hand smoke exposure, firearm use and storage, seat belt use, a need for adhering to healthy diet and exercise. Labs were ordered.  All questions were answered. Last colon 2019 due in 2024

## 2022-07-03 NOTE — Assessment & Plan Note (Addendum)
S/p endometrial ablation 2022 Consider Accrufer

## 2022-07-04 LAB — IRON,TIBC AND FERRITIN PANEL
%SAT: 31 % (calc) (ref 16–45)
Ferritin: 14 ng/mL — ABNORMAL LOW (ref 16–232)
Iron: 122 ug/dL (ref 40–190)
TIBC: 388 mcg/dL (calc) (ref 250–450)

## 2022-07-09 ENCOUNTER — Other Ambulatory Visit: Payer: Self-pay | Admitting: Internal Medicine

## 2022-07-17 ENCOUNTER — Other Ambulatory Visit: Payer: Self-pay | Admitting: Internal Medicine

## 2022-09-11 ENCOUNTER — Encounter: Payer: Self-pay | Admitting: Internal Medicine

## 2022-11-11 ENCOUNTER — Encounter: Payer: Self-pay | Admitting: Nurse Practitioner

## 2022-12-30 ENCOUNTER — Ambulatory Visit: Payer: 59 | Admitting: Internal Medicine

## 2023-01-18 ENCOUNTER — Other Ambulatory Visit: Payer: Self-pay | Admitting: Internal Medicine

## 2023-01-19 ENCOUNTER — Ambulatory Visit: Payer: 59 | Admitting: Nurse Practitioner

## 2023-01-19 NOTE — Telephone Encounter (Signed)
Pamela Thompson, Pls contact patient as I have a patient at 3pm so that will not work for her to just show up. Pls reschedule her for an office visit since I have not seen her previously. If she has seen her PCP within the past year and underwent a normal exam you can schedule her for a virtual visit as the last appt of that day, otherwise, if she hs not seen her PCP she will need an office visit. THX.

## 2023-01-19 NOTE — Telephone Encounter (Signed)
Pt was made aware of Alcide Evener NP recommendations:  Pt was rescheduled to see Dr. Leone Payor on 03/12/2023 at 1:50 PM. Pt made aware.  Pt verbalized understanding with all questions answered.

## 2023-03-12 ENCOUNTER — Other Ambulatory Visit: Payer: 59

## 2023-03-12 ENCOUNTER — Ambulatory Visit (INDEPENDENT_AMBULATORY_CARE_PROVIDER_SITE_OTHER): Payer: 59 | Admitting: Internal Medicine

## 2023-03-12 ENCOUNTER — Encounter: Payer: Self-pay | Admitting: Internal Medicine

## 2023-03-12 VITALS — BP 122/78 | Ht 63.0 in | Wt 182.0 lb

## 2023-03-12 DIAGNOSIS — K200 Eosinophilic esophagitis: Secondary | ICD-10-CM

## 2023-03-12 DIAGNOSIS — Z800 Family history of malignant neoplasm of digestive organs: Secondary | ICD-10-CM

## 2023-03-12 DIAGNOSIS — D509 Iron deficiency anemia, unspecified: Secondary | ICD-10-CM

## 2023-03-12 NOTE — Progress Notes (Signed)
Pamela Thompson 47 y.o. 27-Jan-1976 [id number]  Assessment & Plan:   Encounter Diagnoses  Name Primary?   Eosinophilic esophagitis Yes   Family history of colon cancer    Iron deficiency anemia, unspecified iron deficiency anemia type    Family history of esophageal cancer     Evaluate EOE with EGD.  Consider dilation per patient's wishes will discuss at procedure.  Consider dietary restrictions dairy versus wheat or both.  She has done some gluten free and wonders if that might of helped though she is not gluten-free now.  Notes she may have some component of GERD as well.  Schedule colonoscopy due to family history of colon cancer in her mother and grandmother-screening.  Mom is a heterozygote [facility] 3 abnormality but not thought to be relevant.  She had iron deficiency anemia we think that was due to menorrhagia.  She is curious about the possibility of gluten allergy so we will check antibodies.  Her father had esophageal cancer, I explained that we do not think there is a family link.  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  She did better without dysphagia on PPI but she wanted to try to come off due to concerns about kidney failure dementia and bone fractures.  She successfully did this in the spring though it was rough she said with a lot of heartburn but now she has the need to use antacids about twice a week.  She does have some mild dysphagia and has to drink water with certain foods she says.  No impact dysphagia and regurgitation of food bolus, however.  She does notice a slight cough or wheezy feeling at times and wonders if that is related to reflux.  She went gluten-free for a while though probably never 100% she tried and that was an  attempt to see if that would make a difference with her esophageal symptoms and she thinks it might have.  Mother has a history of colon cancer at age 49 grandmother died at age 15 and mom has numerous polyps.  She had genetic testing and it showed that she had 1 abnormal allele for [facility] 3 but since she was a heterozygote does not have an [facility] polyposis diagnosis.  The patient's father died from esophageal cancer rather quickly in 2020.  There is also a history of iron deficiency anemia thought due to menorrhagia she had iron infusions and endometrial ablation.  Colonoscopy July 31, 2017-normal EGD same day eosinophilic esophagitis-note dilation per patient's request also diminutive hyperplastic gastric polyp removed Allergies  Allergen Reactions   Minocycline Hives    Can take a Zpac   Penicillins Other (See Comments)    Pt does not know reaction   Current Meds  Medication Sig   Ascorbic Acid (VITAMIN C PO) Take 1 tablet by mouth daily.   b complex vitamins capsule Take 1 capsule by mouth daily.   Cholecalciferol (VITAMIN D3) 2000 units TABS Take 1 tablet by mouth daily.   metoprolol succinate (TOPROL-XL) 25 MG 24 hr tablet TAKE 1 TABLET BY MOUTH DAILY. ANNUAL APPT DUE IN DEC MUST SEE PROVIDER FOR FUTURE REFILLS   Past Medical History:  Diagnosis Date   Allergy    Anemia    Clotting disorder (HCC)    2n  pregancy intrauterinal blood clot   Eosinophilic esophagitis 08/05/2017   GERD (gastroesophageal reflux disease)    Hx of blood clots    uterine   Maternal APL (antiphospholipid antibody syndrome) (HCC)    POTS (postural orthostatic tachycardia syndrome)    Past Surgical History:  Procedure Laterality Date   COLONOSCOPY  2019   Multiple due to family history of colon cancer so far negative   ESOPHAGOGASTRODUODENOSCOPY     2013 - 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   Review of Systems See HPI otherwise negative  Objective:   Physical Exam @BP  122/78   Ht 5\' 3"  (1.6 m)   Wt 182 lb (82.6 kg)   BMI 32.24 kg/m @  General:  NAD Eyes:   anicteric Lungs:  clear Heart::  S1S2 no rubs, murmurs or gallops Abdomen:  soft and nontender, BS+     Data Reviewed:  See HPI

## 2023-03-12 NOTE — Patient Instructions (Signed)
Your provider has requested that you go to the basement level for lab work before leaving today. Press "B" on the elevator. The lab is located at the first door on the left as you exit the elevator.  Due to recent changes in healthcare laws, you may see the results of your imaging and laboratory studies on MyChart before your provider has had a chance to review them.  We understand that in some cases there may be results that are confusing or concerning to you. Not all laboratory results come back in the same time frame and the provider may be waiting for multiple results in order to interpret others.  Please give [place] 48 hours in order for your provider to thoroughly review all the results before contacting the office for clarification of your results.   You have been scheduled for an endoscopy and colonoscopy. Please follow the written instructions given to you at your visit today.  Please pick up your prep supplies at the pharmacy within the next 1-3 days.  If you use inhalers (even only as needed), please bring them with you on the day of your procedure.  DO NOT TAKE 7 DAYS PRIOR TO TEST- Trulicity (dulaglutide) Ozempic, Wegovy (semaglutide) Mounjaro (tirzepatide) Bydureon Bcise (exanatide extended release)  DO NOT TAKE 1 DAY PRIOR TO YOUR TEST Rybelsus (semaglutide) Adlyxin (lixisenatide) Victoza (liraglutide) Byetta (exanatide) ___________________________________________________________________________   I appreciate the opportunity to care for you. Stan Head, MD, [facility]

## 2023-03-13 ENCOUNTER — Other Ambulatory Visit: Payer: Self-pay

## 2023-03-13 DIAGNOSIS — K200 Eosinophilic esophagitis: Secondary | ICD-10-CM

## 2023-03-14 LAB — TISSUE TRANSGLUTAMINASE, IGA: (tTG) Ab, IgA: <1 U/mL

## 2023-03-14 LAB — IGA: Immunoglobulin A: 285 mg/dL (ref 47–310)

## 2023-03-14 LAB — IGG: IgG (Immunoglobin G), Serum: 1081 mg/dL (ref 600–1640)

## 2023-03-14 LAB — TEST AUTHORIZATION

## 2023-04-07 ENCOUNTER — Encounter: Payer: Self-pay | Admitting: Internal Medicine

## 2023-04-20 ENCOUNTER — Ambulatory Visit: Payer: 59 | Admitting: Internal Medicine

## 2023-04-20 ENCOUNTER — Encounter: Payer: Self-pay | Admitting: Internal Medicine

## 2023-04-20 VITALS — BP 104/60 | HR 78 | Temp 98.1°F | Resp 18 | Ht 63.0 in | Wt 182.0 lb

## 2023-04-20 DIAGNOSIS — Z800 Family history of malignant neoplasm of digestive organs: Secondary | ICD-10-CM | POA: Diagnosis not present

## 2023-04-20 DIAGNOSIS — Z1211 Encounter for screening for malignant neoplasm of colon: Secondary | ICD-10-CM

## 2023-04-20 DIAGNOSIS — K635 Polyp of colon: Secondary | ICD-10-CM | POA: Diagnosis not present

## 2023-04-20 DIAGNOSIS — D122 Benign neoplasm of ascending colon: Secondary | ICD-10-CM

## 2023-04-20 DIAGNOSIS — K200 Eosinophilic esophagitis: Secondary | ICD-10-CM

## 2023-04-20 DIAGNOSIS — K2100 Gastro-esophageal reflux disease with esophagitis, without bleeding: Secondary | ICD-10-CM

## 2023-04-20 DIAGNOSIS — K2289 Other specified disease of esophagus: Secondary | ICD-10-CM | POA: Diagnosis not present

## 2023-04-20 MED ORDER — SODIUM CHLORIDE 0.9 % IV SOLN: 500.0000 mL | Freq: Once | INTRAVENOUS | Status: DC

## 2023-04-20 MED ORDER — PANTOPRAZOLE SODIUM 40 MG PO TBEC
40.0000 mg | DELAYED_RELEASE_TABLET | Freq: Every day | ORAL | 3 refills | Status: DC
Start: 2023-04-20 — End: 2023-06-17

## 2023-04-20 NOTE — Progress Notes (Signed)
Called to room to assist during endoscopic procedure.  Patient ID and intended procedure confirmed with present staff. Received instructions for my participation in the procedure from the performing physician.  

## 2023-04-20 NOTE — Progress Notes (Signed)
Uneventful anesthetic. Report to pacu rn. Vss. Care resumed by rn. 

## 2023-04-20 NOTE — Op Note (Signed)
[facility] Patient Name: Pamela Thompson Procedure Date: 04/20/2023 3:08 PM MRN: [id number] Endoscopist: Iva Boop , MD, [id number] Age: 47 Referring MD:  Date of Birth: 06-19-1976 Gender: Female Account #: [id number] Procedure:                Upper GI endoscopy Indications:              Dysphagia, Heartburn, Eosinophilic esophagitis,                            Follow-up of eosinophilic esophagitis Medicines:                Monitored Anesthesia Care Procedure:                Pre-Anesthesia Assessment:                           - Prior to the procedure, a History and Physical                            was performed, and patient medications and                            allergies were reviewed. The patient's tolerance of                            previous anesthesia was also reviewed. The risks                            and benefits of the procedure and the sedation                            options and risks were discussed with the patient.                            All questions were answered, and informed consent                            was obtained. Prior Anticoagulants: The patient has                            taken no anticoagulant or antiplatelet agents. ASA                            Grade Assessment: II - A patient with mild systemic                            disease. After reviewing the risks and benefits,                            the patient was deemed in satisfactory condition to                            undergo the procedure.  After obtaining informed consent, the endoscope was                            passed under direct vision. Throughout the                            procedure, the patient's blood pressure, pulse, and                            oxygen saturations were monitored continuously. The                            Olympus scope [id number] was introduced through the                            mouth, and  advanced to the second part of duodenum.                            The upper GI endoscopy was accomplished without                            difficulty. The patient tolerated the procedure                            well. Scope In: Scope Out: Findings:                 LA Grade A (one or more mucosal breaks less than 5                            mm, not extending between tops of 2 mucosal folds)                            esophagitis with no bleeding was found in the                            distal esophagus. Biopsies were taken with a cold                            forceps for histology. Verification of patient                            identification for the specimen was done. Estimated                            blood loss was minimal.                           Mucosal changes including feline appearance and                            longitudinal furrows were found in the entire  esophagus. Biopsies were obtained from the proximal                            and distal esophagus with cold forceps for                            histology of suspected eosinophilic esophagitis.                            Verification of patient identification for the                            specimen was done. Estimated blood loss was minimal.                           The exam was otherwise without abnormality.                           The cardia and gastric fundus were normal on                            retroflexion. Complications:            No immediate complications. Estimated Blood Loss:     Estimated blood loss was minimal. Impression:               - LA Grade A reflux esophagitis with no bleeding.                            Biopsied.                           - Esophageal mucosal changes secondary to                            eosinophilic esophagitis.                           - The examination was otherwise normal.                           - Biopsies were  taken with a cold forceps for                            evaluation of eosinophilic esophagitis. no dilation                            per pre-procedure conversation with patient. Recommendation:           - Patient has a contact number available for                            emergencies. The signs and symptoms of potential                            delayed complications were discussed with the  patient. Return to normal activities tomorrow.                            Written discharge instructions were provided to the                            patient.                           - Resume previous diet.                           - Continue present medications. Restart                            pantoprazole - it has been effective in the past                           - Await pathology results.                           - See the other procedure note for documentation of                            additional recommendations. Iva Boop, MD 04/20/2023 4:00:32 PM This report has been signed electronically.

## 2023-04-20 NOTE — Progress Notes (Signed)
[facility] History and Physical   Primary Care Physician:  Tresa Garter, MD   Reason for Procedure:   Eosinophilic esophagitis, family hx CRCA  Plan:    EGD/colonoscopy     HPI: Pamela Thompson is a 47 y.o. female seen 03/12/23 for following problems    Eosinophilic esophagitis Yes   Family history of colon cancer     Iron deficiency anemia, unspecified iron deficiency anemia type     Family history of esophageal cancer    Celiac testing negative  Past Medical History:  Diagnosis Date   Allergy    Anemia    Clotting disorder (HCC)    2n pregancy intrauterinal blood clot   Eosinophilic esophagitis 08/05/2017   GERD (gastroesophageal reflux disease)    Hx of blood clots    uterine   Maternal APL (antiphospholipid antibody syndrome) (HCC)    POTS (postural orthostatic tachycardia syndrome)     Past Surgical History:  Procedure Laterality Date   COLONOSCOPY  2019   Multiple due to family history of colon cancer so far negative   ESOPHAGOGASTRODUODENOSCOPY     2013 - [place]   LASIK Bilateral    MOHS SURGERY     forhead   UPPER GASTROINTESTINAL ENDOSCOPY     Multiple, eosinophilic esophagitis diagnosed 2019    Prior to Admission medications   Medication Sig Start Date End Date Taking? Authorizing Provider  Ascorbic Acid (VITAMIN C PO) Take 1 tablet by mouth daily.    [provider]  b complex vitamins capsule Take 1 capsule by mouth daily.    [provider]  Cholecalciferol (VITAMIN D3) 2000 units TABS Take 1 tablet by mouth daily.    [provider]  metoprolol succinate (TOPROL-XL) 25 MG 24 hr tablet TAKE 1 TABLET BY MOUTH DAILY. ANNUAL APPT DUE IN DEC MUST SEE PROVIDER FOR FUTURE REFILLS 01/18/23   Plotnikov, Georgina Quint, MD  pantoprazole (PROTONIX) 40 MG tablet TAKE 1 TABLET BY MOUTH DAILY BEFORE BREAKFAST Patient not taking: Reported on 03/12/2023 07/17/22   Plotnikov, Georgina Quint, MD    Current Outpatient  Medications  Medication Sig Dispense Refill   Ascorbic Acid (VITAMIN C PO) Take 1 tablet by mouth daily.     b complex vitamins capsule Take 1 capsule by mouth daily.     Cholecalciferol (VITAMIN D3) 2000 units TABS Take 1 tablet by mouth daily.     metoprolol succinate (TOPROL-XL) 25 MG 24 hr tablet TAKE 1 TABLET BY MOUTH DAILY. ANNUAL APPT DUE IN DEC MUST SEE PROVIDER FOR FUTURE REFILLS 90 tablet 3   pantoprazole (PROTONIX) 40 MG tablet TAKE 1 TABLET BY MOUTH DAILY BEFORE BREAKFAST (Patient not taking: Reported on 03/12/2023) 90 tablet 3   No current facility-administered medications for this visit.    Allergies as of 04/20/2023 - Review Complete 03/12/2023  Allergen Reaction Noted   Minocycline Hives 07/29/2016   Penicillins Other (See Comments) 05/26/2016    Family History  Problem Relation Age of Onset   Colon cancer Mother 98   Rectal cancer Mother    Colon cancer Maternal Grandmother 85       died from mets close to time of dx   Liver disease Maternal Grandmother    Hypertension Father    Cancer Father        Esophageal cancer   Prostate cancer Paternal Grandfather    Esophageal cancer Neg Hx    Stomach cancer Neg Hx     Social History  Socioeconomic History   Marital status: Married    Spouse name: Not on file   Number of children: 2   Years of education: Not on file   Highest education level: Not on file  Occupational History   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  Tobacco Use   Smoking status: Never   Smokeless tobacco: Never  Vaping Use   Vaping status: Never Used  Substance and Sexual Activity   Alcohol use: Yes    Comment: 2 per week   Drug use: No   Sexual activity: Yes  Other Topics Concern   Not on file  Social History Narrative   Married, 2 daughters   She is a Designer, jewellery, works at [employer]    1-2 caffeinated drinks daily   2 alcoholic beverages weekly no drugs no tobacco      Social Determinants of  Corporate investment banker Strain: Not on file  Food Insecurity: Not on file  Transportation Needs: Not on file  Physical Activity: Not on file  Stress: Not on file  Social Connections: Not on file  Intimate Partner Violence: Not on file    Review of Systems:  All other review of systems negative except as mentioned in the HPI.  Physical Exam: Vital signs There were no vitals taken for this visit.  General:   Alert,  Well-developed, well-nourished, pleasant and cooperative in NAD Lungs:  Clear throughout to auscultation.   Heart:  Regular rate and rhythm; no murmurs, clicks, rubs,  or gallops. Abdomen:  Soft, nontender and nondistended. Normal bowel sounds.   Neuro/Psych:  Alert and cooperative. Normal mood and affect. A and O x 3   @Pamela Thompson  Sena Slate, MD, [facility] [phone] (pager) 04/20/2023 2:41 PM@

## 2023-04-20 NOTE — Progress Notes (Signed)
Vitals-SH  Pt's states no medical or surgical changes since previsit or office visit. 

## 2023-04-20 NOTE — Op Note (Signed)
[facility] Patient Name: Pamela Thompson Procedure Date: 04/20/2023 3:07 PM MRN: [id number] Endoscopist: Iva Boop , MD, [id number] Age: 47 Referring MD:  Date of Birth: 1976-03-06 Gender: Female Account #: [id number] Procedure:                Colonoscopy Indications:              Screening patient at increased risk: Family history                            of colorectal cancer in multiple 1st-degree                            relatives Medicines:                Monitored Anesthesia Care Procedure:                Pre-Anesthesia Assessment:                           - Prior to the procedure, a History and Physical                            was performed, and patient medications and                            allergies were reviewed. The patient's tolerance of                            previous anesthesia was also reviewed. The risks                            and benefits of the procedure and the sedation                            options and risks were discussed with the patient.                            All questions were answered, and informed consent                            was obtained. Prior Anticoagulants: The patient has                            taken no anticoagulant or antiplatelet agents. ASA                            Grade Assessment: II - A patient with mild systemic                            disease. After reviewing the risks and benefits,                            the patient was deemed in satisfactory condition to  undergo the procedure.                           After obtaining informed consent, the colonoscope                            was passed under direct vision. Throughout the                            procedure, the patient's blood pressure, pulse, and                            oxygen saturations were monitored continuously. The                            CF [id number] #[id number] was introduced through the anus                             and advanced to the the cecum, identified by                            appendiceal orifice and ileocecal valve. The                            colonoscopy was performed without difficulty. The                            patient tolerated the procedure well. The quality                            of the bowel preparation was good. The ileocecal                            valve, appendiceal orifice, and rectum were                            photographed. Scope In: 3:32:25 PM Scope Out: 3:48:34 PM Scope Withdrawal Time: 0 hours 12 minutes 14 seconds  Total Procedure Duration: 0 hours 16 minutes 9 seconds  Findings:                 The perianal and digital rectal examinations were                            normal.                           Two sessile polyps were found in the ascending                            colon and cecum. The polyps were 1 mm in size.                            These polyps were removed with a cold biopsy  forceps. Resection and retrieval were complete.                            Verification of patient identification for the                            specimen was done. Estimated blood loss was minimal.                           The exam was otherwise without abnormality on                            direct and retroflexion views. Complications:            No immediate complications. Estimated Blood Loss:     Estimated blood loss was minimal. Impression:               - Two 1 mm polyps in the ascending colon and in the                            cecum, removed with a cold biopsy forceps. Resected                            and retrieved.                           - The examination was otherwise normal on direct                            and retroflexion views.                           - family history of colon cancer in mother and                            maternal grandmother Recommendation:           -  Patient has a contact number available for                            emergencies. The signs and symptoms of potential                            delayed complications were discussed with the                            patient. Return to normal activities tomorrow.                            Written discharge instructions were provided to the                            patient.                           - Resume previous diet.                           -  Continue present medications.                           - Await pathology results.                           - Repeat colonoscopy is recommended. The                            colonoscopy date will be determined after pathology                            results from today's exam become available for                            review. Iva Boop, MD 04/20/2023 4:04:52 PM This report has been signed electronically.

## 2023-04-20 NOTE — Patient Instructions (Addendum)
There were signs of eosinophilic esophagitis and GERD. Biopsies taken. Will let you know results. I did not dilate as no stricture seen.  Pantoprazole Rx sent - please start and continue.  Two 1 mm colon polyps removed. I will let you know pathology results and when to have another routine colonoscopy by mail and/or My Chart.  I appreciate the opportunity to care for you. Iva Boop, MD, [facility]  Resume previous diet Continue present medications Handouts/information given for polyps, food choices for GERD and esophagitis  YOU HAD AN ENDOSCOPIC PROCEDURE TODAY AT THE [facility]:   Refer to the procedure report that was given to you for any specific questions about what was found during the examination.  If the procedure report does not answer your questions, please call your gastroenterologist to clarify.  If you requested that your care partner not be given the details of your procedure findings, then the procedure report has been included in a sealed envelope for you to review at your convenience later.  YOU SHOULD EXPECT: Some feelings of bloating in the abdomen. Passage of more gas than usual.  Walking can help get rid of the air that was put into your GI tract during the procedure and reduce the bloating. If you had a lower endoscopy (such as a colonoscopy or flexible sigmoidoscopy) you may notice spotting of blood in your stool or on the toilet paper. If you underwent a bowel prep for your procedure, you may not have a normal bowel movement for a few days.  Please Note:  You might notice some irritation and congestion in your nose or some drainage.  This is from the oxygen used during your procedure.  There is no need for concern and it should clear up in a day or so.  SYMPTOMS TO REPORT IMMEDIATELY:  Following lower endoscopy (colonoscopy):  Excessive amounts of blood in the stool  Significant tenderness or worsening of abdominal pains  Swelling of the abdomen that is  new, acute  Fever of 100F or higher Following upper endoscopy (EGD)  Vomiting of blood or coffee ground material  New chest pain or pain under the shoulder blades  Painful or persistently difficult swallowing  New shortness of breath  Black, tarry-looking stools For urgent or emergent issues, a gastroenterologist can be reached at any hour by calling [phone]. Do not use MyChart messaging for urgent concerns.   DIET:  We do recommend a small meal at first, but then you may proceed to your regular diet.  Drink plenty of fluids but you should avoid alcoholic beverages for 24 hours.  ACTIVITY:  You should plan to take it easy for the rest of today and you should NOT DRIVE or use heavy machinery until tomorrow (because of the sedation medicines used during the test).    FOLLOW UP: Our staff will call the number listed on your records the next business day following your procedure.  We will call around 7:15- 8:00 am to check on you and address any questions or concerns that you may have regarding the information given to you following your procedure. If we do not reach you, we will leave a message.     If any biopsies were taken you will be contacted by phone or by letter within the next 1-3 weeks.  Please call us at [phone] if you have not heard about the biopsies in 3 weeks.   SIGNATURES/CONFIDENTIALITY: You and/or your care partner have signed paperwork which will  be entered into your electronic medical record.  These signatures attest to the fact that that the information above on your After Visit Summary has been reviewed and is understood.  Full responsibility of the confidentiality of this discharge information lies with you and/or your care-partner.

## 2023-04-21 ENCOUNTER — Telehealth: Payer: Self-pay

## 2023-04-21 NOTE — Telephone Encounter (Signed)
RN attempted follow up call to check on patient after colonoscopy on 04/20/23. Phone rang with no voice message; unable to leave vm.

## 2023-04-27 LAB — SURGICAL PATHOLOGY

## 2023-05-04 ENCOUNTER — Encounter: Payer: Self-pay | Admitting: Internal Medicine

## 2023-06-17 ENCOUNTER — Ambulatory Visit: Payer: 59 | Admitting: Internal Medicine

## 2023-06-17 ENCOUNTER — Encounter: Payer: Self-pay | Admitting: Internal Medicine

## 2023-06-17 VITALS — BP 102/64 | HR 79 | Temp 98.1°F | Ht 63.0 in | Wt 179.0 lb

## 2023-06-17 DIAGNOSIS — Z0000 Encounter for general adult medical examination without abnormal findings: Secondary | ICD-10-CM

## 2023-06-17 DIAGNOSIS — D500 Iron deficiency anemia secondary to blood loss (chronic): Secondary | ICD-10-CM

## 2023-06-17 DIAGNOSIS — E7212 Methylenetetrahydrofolate reductase deficiency: Secondary | ICD-10-CM

## 2023-06-17 DIAGNOSIS — E7211 Homocystinuria: Secondary | ICD-10-CM

## 2023-06-17 DIAGNOSIS — N393 Stress incontinence (female) (male): Secondary | ICD-10-CM

## 2023-06-17 DIAGNOSIS — R102 Pelvic and perineal pain: Secondary | ICD-10-CM

## 2023-06-17 MED ORDER — METOPROLOL SUCCINATE ER 25 MG PO TB24: 25.0000 mg | ORAL_TABLET | Freq: Every day | ORAL | 3 refills | Status: DC

## 2023-06-17 MED ORDER — PANTOPRAZOLE SODIUM 40 MG PO TBEC: 40.0000 mg | DELAYED_RELEASE_TABLET | Freq: Every day | ORAL | 3 refills | Status: DC

## 2023-06-17 NOTE — Assessment & Plan Note (Signed)
Post-op pelvic pain and pressure- worked x2 d 10 h a day S/p bladder suspension on 06/09/23 Go home, rest Call Dr Lupe Carney her today

## 2023-06-17 NOTE — Assessment & Plan Note (Signed)
We discussed age appropriate health related issues, including available/recomended screening tests and vaccinations. Labs were ordered to be later reviewed . All questions were answered. We discussed one or more of the following - seat belt use, use of sunscreen/sun exposure exercise, safe sex, fall risk reduction, second hand smoke exposure, firearm use and storage, seat belt use, a need for adhering to healthy diet and exercise. Labs were ordered.  All questions were answered. Last colon 2019 and 2024; due in 2029

## 2023-06-17 NOTE — Assessment & Plan Note (Signed)
On B12 

## 2023-06-17 NOTE — Progress Notes (Signed)
Subjective:  Patient ID: Pamela Thompson, female    DOB: 02-05-1976  Age: 47 y.o. MRN: [id number]  CC: Medical Management of Chronic Issues (6 mnth f/u)   HPI TANNETTE CLEMENSON presents for pelvic pain and pressure- worked x2 d 10 h a day S/p bladder suspension on 06/09/23  Outpatient Medications Prior to Visit  Medication Sig Dispense Refill   Ascorbic Acid (VITAMIN C PO) Take 1 tablet by mouth daily.     Cholecalciferol (VITAMIN D3) 2000 units TABS Take 1 tablet by mouth daily.     ibuprofen (ADVIL) 800 MG tablet After appointment, take one tablet every 8hrs prn pain, all with food.     metoprolol succinate (TOPROL-XL) 25 MG 24 hr tablet TAKE 1 TABLET BY MOUTH DAILY. ANNUAL APPT DUE IN DEC MUST SEE PROVIDER FOR FUTURE REFILLS 90 tablet 3   metoprolol succinate (TOPROL-XL) 25 MG 24 hr tablet Take by mouth.     pantoprazole (PROTONIX) 40 MG tablet Take 1 tablet (40 mg total) by mouth daily before breakfast. 90 tablet 3   No facility-administered medications prior to visit.    ROS: Review of Systems  Constitutional:  Negative for activity change, appetite change, chills, fatigue and unexpected weight change.  HENT:  Negative for congestion, mouth sores and sinus pressure.   Eyes:  Negative for visual disturbance.  Respiratory:  Negative for cough and chest tightness.   Gastrointestinal:  Negative for abdominal pain and nausea.  Genitourinary:  Negative for difficulty urinating, frequency and vaginal pain.  Musculoskeletal:  Negative for back pain and gait problem.  Skin:  Negative for pallor and rash.  Neurological:  Negative for dizziness, tremors, weakness, numbness and headaches.  Psychiatric/Behavioral:  Negative for confusion and sleep disturbance.     Objective:  BP 102/64 (BP Location: Right Arm, Patient Position: Sitting, Cuff Size: Normal)   Pulse 79   Temp 98.1 F (36.7 C) (Oral)   Ht 5\' 3"  (1.6 m)   Wt 179 lb (81.2 kg)   SpO2 92%   BMI 31.71 kg/m   BP Readings  from Last 3 Encounters:  06/17/23 102/64  04/20/23 104/60  03/12/23 122/78    Wt Readings from Last 3 Encounters:  06/17/23 179 lb (81.2 kg)  04/20/23 182 lb (82.6 kg)  03/12/23 182 lb (82.6 kg)    Physical Exam Constitutional:      General: She is not in acute distress.    Appearance: She is well-developed. She is obese.  HENT:     Head: Normocephalic.     Right Ear: External ear normal.     Left Ear: External ear normal.     Nose: Nose normal.  Eyes:     General:        Right eye: No discharge.        Left eye: No discharge.     Conjunctiva/sclera: Conjunctivae normal.     Pupils: Pupils are equal, round, and reactive to light.  Neck:     Thyroid: No thyromegaly.     Vascular: No JVD.     Trachea: No tracheal deviation.  Cardiovascular:     Rate and Rhythm: Normal rate and regular rhythm.     Heart sounds: Normal heart sounds.  Pulmonary:     Effort: No respiratory distress.     Breath sounds: No stridor. No wheezing.  Abdominal:     General: Bowel sounds are normal. There is no distension.     Palpations: Abdomen is soft. There is  no mass.     Tenderness: There is no abdominal tenderness. There is no guarding or rebound.  Musculoskeletal:        General: No tenderness.     Cervical back: Normal range of motion and neck supple. No rigidity.  Lymphadenopathy:     Cervical: No cervical adenopathy.  Skin:    Findings: No erythema or rash.  Neurological:     Cranial Nerves: No cranial nerve deficit.     Motor: No abnormal muscle tone.     Coordination: Coordination normal.     Deep Tendon Reflexes: Reflexes normal.  Psychiatric:        Behavior: Behavior normal.        Thought Content: Thought content normal.        Judgment: Judgment normal.   Walking slow Abd S/NT   Lab Results  Component Value Date   WBC 6.8 07/03/2022   HGB 13.4 07/03/2022   HCT 38.9 07/03/2022   PLT 246.0 07/03/2022   GLUCOSE 93 07/03/2022   CHOL 201 (H) 07/03/2022   TRIG 110.0  07/03/2022   HDL 70.20 07/03/2022   LDLCALC 109 (H) 07/03/2022   ALT 19 07/03/2022   AST 24 07/03/2022   NA 138 07/03/2022   K 3.9 07/03/2022   CL 100 07/03/2022   CREATININE 0.77 07/03/2022   BUN 17 07/03/2022   CO2 27 07/03/2022   TSH 2.77 07/03/2022   INR 0.88 07/02/2018    No results found.  Assessment & Plan:   Problem List Items Addressed This Visit     Well adult exam - Primary    We discussed age appropriate health related issues, including available/recomended screening tests and vaccinations. Labs were ordered to be later reviewed . All questions were answered. We discussed one or more of the following - seat belt use, use of sunscreen/sun exposure exercise, safe sex, fall risk reduction, second hand smoke exposure, firearm use and storage, seat belt use, a need for adhering to healthy diet and exercise. Labs were ordered.  All questions were answered. Last colon 2019 and 2024; due in 2029      Relevant Orders   TSH   Urinalysis   CBC with Differential/Platelet   Lipid panel   Comprehensive metabolic panel   Iron, TIBC and Ferritin Panel   MTHFR (methylene THF reductase) deficiency and homocystinuria (HCC)    On B12      Iron deficiency anemia   Relevant Orders   Iron, TIBC and Ferritin Panel   Stress incontinence, female    Post-op pelvic pain and pressure- worked x2 d 10 h a day S/p bladder suspension on 06/09/23 Go home, rest Call Dr Lupe Carney her today      Pelvic pain    Post-op pelvic pain and pressure- worked x2 d 10 h a day S/p bladder suspension on 06/09/23 Go home, rest Call Dr Lupe Carney her today      Relevant Orders   Iron, TIBC and Ferritin Panel      Meds ordered this encounter  Medications   metoprolol succinate (TOPROL-XL) 25 MG 24 hr tablet    Sig: Take 1 tablet (25 mg total) by mouth daily.    Dispense:  90 tablet    Refill:  3   pantoprazole (PROTONIX) 40 MG tablet    Sig: Take 1 tablet (40 mg total) by mouth daily  before breakfast.    Dispense:  90 tablet    Refill:  3      Follow-up:  Return in about 1 year (around 06/16/2024) for Wellness Exam.  Sonda Primes, MD

## 2023-11-24 ENCOUNTER — Ambulatory Visit: Admitting: Internal Medicine

## 2023-12-08 ENCOUNTER — Other Ambulatory Visit (INDEPENDENT_AMBULATORY_CARE_PROVIDER_SITE_OTHER)

## 2023-12-08 DIAGNOSIS — D500 Iron deficiency anemia secondary to blood loss (chronic): Secondary | ICD-10-CM

## 2023-12-08 DIAGNOSIS — Z0000 Encounter for general adult medical examination without abnormal findings: Secondary | ICD-10-CM | POA: Diagnosis not present

## 2023-12-08 DIAGNOSIS — R102 Pelvic and perineal pain: Secondary | ICD-10-CM

## 2023-12-08 LAB — CBC WITH DIFFERENTIAL/PLATELET
Basophils Absolute: 0 10*3/uL (ref 0.0–0.1)
Basophils Relative: 0.2 % (ref 0.0–3.0)
Eosinophils Absolute: 0.1 10*3/uL (ref 0.0–0.7)
Eosinophils Relative: 1.6 % (ref 0.0–5.0)
HCT: 41.4 % (ref 36.0–46.0)
Hemoglobin: 14 g/dL (ref 12.0–15.0)
Lymphocytes Relative: 26.7 % (ref 12.0–46.0)
Lymphs Abs: 2.2 10*3/uL (ref 0.7–4.0)
MCHC: 33.8 g/dL (ref 30.0–36.0)
MCV: 92.5 fl (ref 78.0–100.0)
Monocytes Absolute: 0.5 10*3/uL (ref 0.1–1.0)
Monocytes Relative: 6.8 % (ref 3.0–12.0)
Neutro Abs: 5.2 10*3/uL (ref 1.4–7.7)
Neutrophils Relative %: 64.7 % (ref 43.0–77.0)
Platelets: 285 10*3/uL (ref 150.0–400.0)
RBC: 4.47 Mil/uL (ref 3.87–5.11)
RDW: 12.8 % (ref 11.5–15.5)
WBC: 8.1 10*3/uL (ref 4.0–10.5)

## 2023-12-08 LAB — LIPID PANEL
Cholesterol: 199 mg/dL (ref 0–200)
HDL: 48.5 mg/dL (ref >39.00)
LDL Cholesterol: 130 mg/dL — ABNORMAL HIGH (ref 0–99)
NonHDL: 150.36
Total CHOL/HDL Ratio: 4
Triglycerides: 101 mg/dL (ref 0.0–149.0)
VLDL: 20.2 mg/dL (ref 0.0–40.0)

## 2023-12-08 LAB — URINALYSIS, ROUTINE W REFLEX MICROSCOPIC
Bilirubin Urine: NEGATIVE
Ketones, ur: NEGATIVE
Leukocytes,Ua: NEGATIVE
Nitrite: NEGATIVE
Specific Gravity, Urine: 1.01 (ref 1.000–1.030)
Total Protein, Urine: NEGATIVE
Urine Glucose: NEGATIVE
Urobilinogen, UA: 0.2 (ref 0.0–1.0)
WBC, UA: NONE SEEN (ref 0–?)
pH: 6 (ref 5.0–8.0)

## 2023-12-08 LAB — COMPREHENSIVE METABOLIC PANEL WITH GFR
ALT: 13 U/L (ref 0–35)
AST: 17 U/L (ref 0–37)
Albumin: 4.5 g/dL (ref 3.5–5.2)
Alkaline Phosphatase: 48 U/L (ref 39–117)
BUN: 15 mg/dL (ref 6–23)
CO2: 27 meq/L (ref 19–32)
Calcium: 9.4 mg/dL (ref 8.4–10.5)
Chloride: 102 meq/L (ref 96–112)
Creatinine, Ser: 0.83 mg/dL (ref 0.40–1.20)
GFR: 82.85 mL/min (ref >60.00)
Glucose, Bld: 102 mg/dL — ABNORMAL HIGH (ref 70–99)
Potassium: 3.9 meq/L (ref 3.5–5.1)
Sodium: 138 meq/L (ref 135–145)
Total Bilirubin: 0.6 mg/dL (ref 0.2–1.2)
Total Protein: 7.6 g/dL (ref 6.0–8.3)

## 2023-12-08 LAB — TSH: TSH: 2.26 u[IU]/mL (ref 0.35–5.50)

## 2023-12-09 LAB — IRON,TIBC AND FERRITIN PANEL
%SAT: 25 % (ref 16–45)
Ferritin: 37 ng/mL (ref 16–232)
Iron: 90 ug/dL (ref 40–190)
TIBC: 354 ug/dL (ref 250–450)

## 2023-12-10 ENCOUNTER — Ambulatory Visit: Admitting: Internal Medicine

## 2023-12-15 ENCOUNTER — Ambulatory Visit: Payer: Self-pay | Admitting: Internal Medicine

## 2024-02-23 ENCOUNTER — Other Ambulatory Visit: Payer: Self-pay | Admitting: Obstetrics and Gynecology

## 2024-02-23 DIAGNOSIS — Z1231 Encounter for screening mammogram for malignant neoplasm of breast: Secondary | ICD-10-CM

## 2024-03-01 ENCOUNTER — Encounter: Payer: Self-pay | Admitting: Oncology

## 2024-03-01 ENCOUNTER — Inpatient Hospital Stay
Admission: RE | Admit: 2024-03-01 | Discharge: 2024-03-01 | Discharge status: Home / Self Care | Source: Ambulatory Visit | Attending: Obstetrics and Gynecology | Admitting: Obstetrics and Gynecology

## 2024-03-01 DIAGNOSIS — Z1231 Encounter for screening mammogram for malignant neoplasm of breast: Secondary | ICD-10-CM

## 2024-03-28 ENCOUNTER — Telehealth: Payer: Self-pay | Admitting: Radiology

## 2024-03-28 NOTE — Telephone Encounter (Signed)
 Copied from CRM [id number]. Topic: Clinical - Medication Question >> Mar 28, 2024 12:44 PM Pamela Thompson wrote: Reason for CRM: patient is requesting for a Hep A & B vaccine order to be put in for her

## 2024-04-29 ENCOUNTER — Other Ambulatory Visit: Payer: Self-pay | Admitting: Internal Medicine

## 2024-05-08 ENCOUNTER — Encounter: Payer: Self-pay | Admitting: Internal Medicine

## 2024-06-16 ENCOUNTER — Ambulatory Visit: Payer: Self-pay | Admitting: Internal Medicine

## 2024-06-16 ENCOUNTER — Encounter: Payer: Self-pay | Admitting: Internal Medicine

## 2024-06-16 ENCOUNTER — Ambulatory Visit: Payer: 59 | Admitting: Internal Medicine

## 2024-06-16 ENCOUNTER — Ambulatory Visit (INDEPENDENT_AMBULATORY_CARE_PROVIDER_SITE_OTHER)

## 2024-06-16 ENCOUNTER — Other Ambulatory Visit: Payer: Self-pay | Admitting: Internal Medicine

## 2024-06-16 VITALS — BP 126/84 | HR 94 | Ht 63.0 in | Wt 170.8 lb

## 2024-06-16 DIAGNOSIS — D6861 Antiphospholipid syndrome: Secondary | ICD-10-CM

## 2024-06-16 DIAGNOSIS — R5383 Other fatigue: Secondary | ICD-10-CM

## 2024-06-16 DIAGNOSIS — R052 Subacute cough: Secondary | ICD-10-CM

## 2024-06-16 DIAGNOSIS — K2100 Gastro-esophageal reflux disease with esophagitis, without bleeding: Secondary | ICD-10-CM

## 2024-06-16 DIAGNOSIS — Z0000 Encounter for general adult medical examination without abnormal findings: Secondary | ICD-10-CM

## 2024-06-16 DIAGNOSIS — R059 Cough, unspecified: Secondary | ICD-10-CM | POA: Insufficient documentation

## 2024-06-16 DIAGNOSIS — R195 Other fecal abnormalities: Secondary | ICD-10-CM | POA: Insufficient documentation

## 2024-06-16 LAB — COMPREHENSIVE METABOLIC PANEL WITH GFR
ALT: 12 U/L (ref 0–35)
AST: 18 U/L (ref 0–37)
Albumin: 4.7 g/dL (ref 3.5–5.2)
Alkaline Phosphatase: 48 U/L (ref 39–117)
BUN: 17 mg/dL (ref 6–23)
CO2: 29 meq/L (ref 19–32)
Calcium: 10.2 mg/dL (ref 8.4–10.5)
Chloride: 103 meq/L (ref 96–112)
Creatinine, Ser: 0.81 mg/dL (ref 0.40–1.20)
GFR: 85 mL/min (ref >60.00)
Glucose, Bld: 90 mg/dL (ref 70–99)
Potassium: 4 meq/L (ref 3.5–5.1)
Sodium: 139 meq/L (ref 135–145)
Total Bilirubin: 0.6 mg/dL (ref 0.2–1.2)
Total Protein: 7.2 g/dL (ref 6.0–8.3)

## 2024-06-16 LAB — CBC WITH DIFFERENTIAL/PLATELET
Basophils Absolute: 0 K/uL (ref 0.0–0.1)
Basophils Relative: 0.4 % (ref 0.0–3.0)
Eosinophils Absolute: 0.2 K/uL (ref 0.0–0.7)
Eosinophils Relative: 4.4 % (ref 0.0–5.0)
HCT: 40.9 % (ref 36.0–46.0)
Hemoglobin: 13.9 g/dL (ref 12.0–15.0)
Lymphocytes Relative: 37.2 % (ref 12.0–46.0)
Lymphs Abs: 2.1 K/uL (ref 0.7–4.0)
MCHC: 34.1 g/dL (ref 30.0–36.0)
MCV: 92.2 fl (ref 78.0–100.0)
Monocytes Absolute: 0.4 K/uL (ref 0.1–1.0)
Monocytes Relative: 7.4 % (ref 3.0–12.0)
Neutro Abs: 2.9 K/uL (ref 1.4–7.7)
Neutrophils Relative %: 50.6 % (ref 43.0–77.0)
Platelets: 227 K/uL (ref 150.0–400.0)
RBC: 4.44 Mil/uL (ref 3.87–5.11)
RDW: 12.8 % (ref 11.5–15.5)
WBC: 5.7 K/uL (ref 4.0–10.5)

## 2024-06-16 LAB — URINALYSIS
Bilirubin Urine: NEGATIVE
Hgb urine dipstick: NEGATIVE
Ketones, ur: NEGATIVE
Leukocytes,Ua: NEGATIVE
Nitrite: NEGATIVE
Specific Gravity, Urine: 1.01 (ref 1.000–1.030)
Total Protein, Urine: NEGATIVE
Urine Glucose: NEGATIVE
Urobilinogen, UA: 0.2 (ref 0.0–1.0)
pH: 7 (ref 5.0–8.0)

## 2024-06-16 LAB — LIPID PANEL
Cholesterol: 209 mg/dL — ABNORMAL HIGH (ref 0–200)
HDL: 46.4 mg/dL (ref >39.00)
LDL Cholesterol: 140 mg/dL — ABNORMAL HIGH (ref 0–99)
NonHDL: 162.28
Total CHOL/HDL Ratio: 4
Triglycerides: 111 mg/dL (ref 0.0–149.0)
VLDL: 22.2 mg/dL (ref 0.0–40.0)

## 2024-06-16 LAB — PSA: PSA: 0 ng/mL — ABNORMAL LOW (ref 0.10–4.00)

## 2024-06-16 LAB — TSH: TSH: 2.03 u[IU]/mL (ref 0.35–5.50)

## 2024-06-16 MED ORDER — SACCHAROMYCES BOULARDII 250 MG PO CAPS: 250.0000 mg | ORAL_CAPSULE | Freq: Two times a day (BID) | ORAL | 1 refills | Status: AC

## 2024-06-16 MED ORDER — AIRSUPRA 90-80 MCG/ACT IN AERO: 2.0000 | INHALATION_SPRAY | RESPIRATORY_TRACT | 5 refills | Status: AC | PRN

## 2024-06-16 MED ORDER — MEBENDAZOLE 100 MG PO CHEW: 100.0000 mg | CHEWABLE_TABLET | Freq: Two times a day (BID) | ORAL | 1 refills | Status: DC

## 2024-06-16 MED ORDER — AZITHROMYCIN 250 MG PO TABS: ORAL_TABLET | ORAL | 0 refills | Status: DC

## 2024-06-16 NOTE — Assessment & Plan Note (Addendum)
 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 C/o soft stools with mucus since she came back.  She took some Pepto-Bismol.  No chills, no weight loss, no abdominal pain.  Her daughter, a archivist, who went with her is okay. Stool for Giardia/crypto.  Stool for lactoferrin Empiric mebendazole  2 courses to cover for roundworms and hookworms. Florastor 1 tablet twice a day for 2 weeks

## 2024-06-16 NOTE — Patient Instructions (Signed)
Florastor

## 2024-06-16 NOTE — Progress Notes (Signed)
 I thank you show okay thank you tomorrow mildly different models  Subjective:  Patient ID: Pamela Thompson, female    DOB: [birth year]  Age: 48 y.o. 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 C/o soft stools with mucus since she came back.  She took some Pepto-Bismol.  No chills, no weight loss, no abdominal pain.  Her daughter, a archivist, who went with her is okay.  Outpatient Medications Prior to Visit  Medication Sig Dispense Refill   Ascorbic Acid (VITAMIN C PO) Take 1 tablet by mouth daily.     Cholecalciferol (VITAMIN D3) 2000 units TABS Take 1 tablet by mouth daily.     ibuprofen (ADVIL) 800 MG tablet After appointment, take one tablet every 8hrs prn pain, all with food.     metoprolol  succinate (TOPROL -XL) 25 MG 24 hr tablet TAKE 1 TABLET (25 MG TOTAL) BY MOUTH DAILY. DR REDDYS MFG ONLY 90 tablet 3   pantoprazole  (PROTONIX ) 40 MG tablet Take 1 tablet (40 mg total) by mouth daily before breakfast. 90 tablet 3   No facility-administered medications prior to visit.    ROS: Review of Systems  Constitutional:  Negative for activity change, appetite change, chills, fatigue and unexpected weight change.  HENT:  Negative for congestion, mouth sores and sinus pressure.   Eyes:  Negative for visual disturbance.  Respiratory:  Positive for cough. Negative for chest tightness.   Gastrointestinal:  Negative for abdominal pain, blood in stool, diarrhea and nausea.  Genitourinary:  Negative for difficulty urinating, frequency and vaginal pain.  Musculoskeletal:  Negative for back pain and gait problem.  Skin:  Negative for pallor and rash.  Neurological:  Negative for dizziness, tremors, weakness, numbness and headaches.  Psychiatric/Behavioral:  Negative for confusion and sleep disturbance. The patient is not  nervous/anxious.     Objective:  BP 126/84   Pulse 94   Ht 5' 3 (1.6 m)   Wt 170 lb 12.8 oz (77.5 kg)   LMP 06/22/2022 (Approximate)   SpO2 99%   BMI 30.26 kg/m   BP Readings from Last 3 Encounters:  06/16/24 126/84  06/17/23 102/64  04/20/23 104/60    Wt Readings from Last 3 Encounters:  06/16/24 170 lb 12.8 oz (77.5 kg)  06/17/23 179 lb (81.2 kg)  04/20/23 182 lb (82.6 kg)    Physical Exam Constitutional:      General: She is not in acute distress.    Appearance: She is well-developed.  HENT:     Head: Normocephalic.     Right Ear: External ear normal.     Left Ear: External ear normal.     Nose: Nose normal.  Eyes:     General:        Right eye: No discharge.        Left eye: No discharge.     Conjunctiva/sclera: Conjunctivae normal.     Pupils: Pupils are equal, round, and reactive to light.  Neck:     Thyroid : No thyromegaly.     Vascular: No JVD.     Trachea: No tracheal deviation.  Cardiovascular:     Rate and Rhythm: Normal rate and regular rhythm.     Heart sounds: Normal heart sounds.  Pulmonary:     Effort: No respiratory distress.  Breath sounds: No stridor. No wheezing.  Abdominal:     General: Bowel sounds are normal. There is no distension.     Palpations: Abdomen is soft. There is no mass.     Tenderness: There is no abdominal tenderness. There is no guarding or rebound.  Musculoskeletal:        General: No tenderness.     Cervical back: Normal range of motion and neck supple. No rigidity.  Lymphadenopathy:     Cervical: No cervical adenopathy.  Skin:    Findings: No erythema or rash.  Neurological:     Cranial Nerves: No cranial nerve deficit.     Motor: No abnormal muscle tone.     Coordination: Coordination normal.     Deep Tendon Reflexes: Reflexes normal.  Psychiatric:        Behavior: Behavior normal.        Thought Content: Thought content normal.        Judgment: Judgment normal.     Lab Results  Component Value  Date   WBC 8.1 12/08/2023   HGB 14.0 12/08/2023   HCT 41.4 12/08/2023   PLT 285.0 12/08/2023   GLUCOSE 102 (H) 12/08/2023   CHOL 199 12/08/2023   TRIG 101.0 12/08/2023   HDL 48.50 12/08/2023   LDLCALC 130 (H) 12/08/2023   ALT 13 12/08/2023   AST 17 12/08/2023   NA 138 12/08/2023   K 3.9 12/08/2023   CL 102 12/08/2023   CREATININE 0.83 12/08/2023   BUN 15 12/08/2023   CO2 27 12/08/2023   TSH 2.26 12/08/2023   INR 0.88 07/02/2018    MM 3D SCREENING MAMMOGRAM BILATERAL BREAST Result Date: 03/03/2024 CLINICAL DATA:  Screening. EXAM: DIGITAL SCREENING BILATERAL MAMMOGRAM WITH TOMOSYNTHESIS AND CAD TECHNIQUE: Bilateral screening digital craniocaudal and mediolateral oblique mammograms were obtained. Bilateral screening digital breast tomosynthesis was performed. The images were evaluated with computer-aided detection. COMPARISON:  Previous exam(s). ACR Breast Density Category b: There are scattered areas of fibroglandular density. FINDINGS: There are no findings suspicious for malignancy. IMPRESSION: No mammographic evidence of malignancy. A result letter of this screening mammogram will be mailed directly to the patient. RECOMMENDATION: Screening mammogram in one year. (Code:SM-B-01Y) BI-RADS CATEGORY  1: Negative. Electronically Signed   By: Alm Parkins M.D.   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 C/o soft stools with mucus since she came back.  She took some Pepto-Bismol.  No chills, no weight loss, no abdominal pain.  Her daughter, a archivist, who went with her is okay. 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 C/o soft stools with mucus since she came back.  She took some Pepto-Bismol.  No chills, no weight loss, no abdominal  pain.  Her daughter, a archivist, who went with her is okay. Stool for Giardia/crypto.  Stool for lactoferrin Empiric mebendazole  2 courses to cover for roundworms and hookworms. Florastor 1 tablet twice a day for 2 weeks      Relevant Orders   Giardia/cryptosporidium (EIA)   Fecal lactoferrin, quant   Well adult exam - Primary   We discussed age appropriate health related issues, including available/recomended screening tests and vaccinations. Labs were ordered to be later reviewed . All questions were answered. We discussed one or more of the following - seat belt use, use of sunscreen/sun exposure exercise, safe sex, fall risk reduction, second hand smoke exposure, firearm use and storage, seat belt use, a need for adhering to healthy diet and exercise. Labs were ordered.  All questions were answered. Last colon 2019 and 2024; due in 2029      Relevant Orders   TSH   Urinalysis   CBC with Differential/Platelet   Lipid panel   PSA   Comprehensive metabolic panel with GFR      Meds ordered this encounter  Medications   mebendazole  (VERMOX ) 100 MG chewable tablet    Sig: Chew 1 tablet (100 mg total) by mouth 2 (two) times daily.    Dispense:  6 tablet    Refill:  1   azithromycin  (ZITHROMAX  Z-PAK) 250 MG tablet    Sig: As directed    Dispense:  6 tablet    Refill:  0   Albuterol-Budesonide (AIRSUPRA) 90-80 MCG/ACT AERO    Sig: Inhale 2 Inhalations into the lungs every 4 (four) hours as needed.    Dispense:  10 g    Refill:  5   saccharomyces boulardii (FLORASTOR) 250 MG capsule    Sig: Take 1 capsule (250 mg  total) by mouth 2 (two) times daily.    Dispense:  30 capsule    Refill:  1      Follow-up: Return in about 6 weeks (around 07/28/2024) for a follow-up visit.  Marolyn Noel, MD feeling good I think I have some sort of cough for several weeks that I feel fine

## 2024-06-16 NOTE — Assessment & Plan Note (Signed)
 We discussed age appropriate health related issues, including available/recomended screening tests and vaccinations. Labs were ordered to be later reviewed . All questions were answered. We discussed one or more of the following - seat belt use, use of sunscreen/sun exposure exercise, safe sex, fall risk reduction, second hand smoke exposure, firearm use and storage, seat belt use, a need for adhering to healthy diet and exercise. Labs were ordered.  All questions were answered. Last colon 2019 and 2024; due in 2029

## 2024-06-16 NOTE — Assessment & Plan Note (Addendum)
 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 C/o soft stools with mucus since she came back.  She took some Pepto-Bismol.  No chills, no weight loss, no abdominal pain.  Her daughter, a archivist, who went with her is okay. Empiric Z-Pak Chest x-ray Airsupra inhaler 2 puffs 4 times a day until better Return to clinic in 6 weeks

## 2024-06-16 NOTE — Assessment & Plan Note (Signed)
 Doing well

## 2024-06-16 NOTE — Assessment & Plan Note (Signed)
 Better

## 2024-06-16 NOTE — Assessment & Plan Note (Signed)
 On Protonix

## 2024-08-02 ENCOUNTER — Ambulatory Visit: Admitting: Internal Medicine

## 2024-08-02 ENCOUNTER — Encounter: Payer: Self-pay | Admitting: Internal Medicine

## 2024-08-02 VITALS — BP 128/80 | HR 78 | Ht 63.0 in | Wt 176.2 lb

## 2024-08-02 DIAGNOSIS — H02403 Unspecified ptosis of bilateral eyelids: Secondary | ICD-10-CM

## 2024-08-02 DIAGNOSIS — R052 Subacute cough: Secondary | ICD-10-CM

## 2024-08-02 DIAGNOSIS — R195 Other fecal abnormalities: Secondary | ICD-10-CM

## 2024-08-02 DIAGNOSIS — H02409 Unspecified ptosis of unspecified eyelid: Secondary | ICD-10-CM | POA: Insufficient documentation

## 2024-08-02 NOTE — Assessment & Plan Note (Signed)
 Resolved

## 2024-08-02 NOTE — Progress Notes (Signed)
 "  Subjective:  Patient ID: Pamela Thompson, female    DOB: 1976-01-09  Age: 49 y.o. MRN: [id number]  CC: Medical Management of Chronic Issues (6 Month follow up)   HPI KENYATA NAPIER presents for mucus in stool - resolved  Outpatient Medications Prior to Visit  Medication Sig Dispense Refill   Albuterol-Budesonide (AIRSUPRA ) 90-80 MCG/ACT AERO Inhale 2 Inhalations into the lungs every 4 (four) hours as needed. 10 g 5   Ascorbic Acid (VITAMIN C PO) Take 1 tablet by mouth daily.     Cholecalciferol (VITAMIN D3) 2000 units TABS Take 1 tablet by mouth daily.     ibuprofen (ADVIL) 800 MG tablet After appointment, take one tablet every 8hrs prn pain, all with food.     metoprolol  succinate (TOPROL -XL) 25 MG 24 hr tablet TAKE 1 TABLET (25 MG TOTAL) BY MOUTH DAILY. DR REDDYS MFG ONLY 90 tablet 3   pantoprazole  (PROTONIX ) 40 MG tablet Take 1 tablet (40 mg total) by mouth daily before breakfast. 90 tablet 3   saccharomyces boulardii (FLORASTOR) 250 MG capsule Take 1 capsule (250 mg total) by mouth 2 (two) times daily. 30 capsule 1   azithromycin  (ZITHROMAX  Z-PAK) 250 MG tablet As directed 6 tablet 0   EMVERM  100 MG chewable tablet CHEW 1 TABLET (100 MG TOTAL) BY MOUTH 2 (TWO) TIMES DAILY. 6 tablet 1   No facility-administered medications prior to visit.    ROS: Review of Systems  Constitutional:  Negative for activity change, appetite change, chills, fatigue and unexpected weight change.  HENT:  Negative for congestion, mouth sores and sinus pressure.   Eyes:  Negative for visual disturbance.  Respiratory:  Negative for cough and chest tightness.   Gastrointestinal:  Negative for abdominal pain and nausea.  Genitourinary:  Negative for difficulty urinating, frequency and vaginal pain.  Musculoskeletal:  Negative for back pain and gait problem.  Skin:  Negative for pallor and rash.  Neurological:  Negative for dizziness, tremors, weakness, numbness and headaches.  Psychiatric/Behavioral:   Negative for confusion and sleep disturbance.     Objective:  BP 128/80   Pulse 78   Ht 5' 3 (1.6 m)   Wt 176 lb 3.2 oz (79.9 kg)   LMP 06/22/2022   SpO2 99%   BMI 31.21 kg/m   BP Readings from Last 3 Encounters:  08/02/24 128/80  06/16/24 126/84  06/17/23 102/64    Wt Readings from Last 3 Encounters:  08/02/24 176 lb 3.2 oz (79.9 kg)  06/16/24 170 lb 12.8 oz (77.5 kg)  06/17/23 179 lb (81.2 kg)    Physical Exam  Lab Results  Component Value Date   WBC 5.7 06/16/2024   HGB 13.9 06/16/2024   HCT 40.9 06/16/2024   PLT 227.0 06/16/2024   GLUCOSE 90 06/16/2024   CHOL 209 (H) 06/16/2024   TRIG 111.0 06/16/2024   HDL 46.40 06/16/2024   LDLCALC 140 (H) 06/16/2024   ALT 12 06/16/2024   AST 18 06/16/2024   NA 139 06/16/2024   K 4.0 06/16/2024   CL 103 06/16/2024   CREATININE 0.81 06/16/2024   BUN 17 06/16/2024   CO2 29 06/16/2024   TSH 2.03 06/16/2024   PSA 0.00 (L) 06/16/2024   INR 0.88 07/02/2018    MM 3D SCREENING MAMMOGRAM BILATERAL BREAST Result Date: 03/03/2024 CLINICAL DATA:  Screening. EXAM: DIGITAL SCREENING BILATERAL MAMMOGRAM WITH TOMOSYNTHESIS AND CAD TECHNIQUE: Bilateral screening digital craniocaudal and mediolateral oblique mammograms were obtained. Bilateral screening digital breast tomosynthesis was performed.  The images were evaluated with computer-aided detection. COMPARISON:  Previous exam(s). ACR Breast Density Category b: There are scattered areas of fibroglandular density. FINDINGS: There are no findings suspicious for malignancy. IMPRESSION: No mammographic evidence of malignancy. A result letter of this screening mammogram will be mailed directly to the patient. RECOMMENDATION: Screening mammogram in one year. (Code:SM-B-01Y) BI-RADS CATEGORY  1: Negative. Electronically Signed   By: Alm Parkins M.D.   On: 03/03/2024 15:10    Assessment & Plan:   Problem List Items Addressed This Visit     Cough   Resolved      Stool mucus - Primary    Resolved      Ptosis   Eyelid surgery is pending at [facility]         No orders of the defined types were placed in this encounter.     Follow-up: Return in about 8 months (around 04/02/2025) for Wellness Exam.  Marolyn Noel, MD "

## 2024-08-02 NOTE — Assessment & Plan Note (Signed)
 Eyelid surgery is pending at [facility]

## 2024-08-02 NOTE — Assessment & Plan Note (Addendum)
 Resolved

## 2024-08-07 ENCOUNTER — Other Ambulatory Visit: Payer: Self-pay | Admitting: Internal Medicine

## 2025-06-19 ENCOUNTER — Encounter: Admitting: Internal Medicine
